# Patient Record
Sex: Female | Born: 1975 | Race: Black or African American | Hispanic: No | Marital: Single | Smoking: Current some day smoker
Health system: Southern US, Community
[De-identification: ages and names within clinical notes are randomized; demographics above are authoritative.]

## PROBLEM LIST (undated history)

## (undated) DIAGNOSIS — E669 Obesity, unspecified: Secondary | ICD-10-CM

## (undated) DIAGNOSIS — I10 Essential (primary) hypertension: Secondary | ICD-10-CM

## (undated) HISTORY — DX: Obesity, unspecified: E66.9

---
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
## 1998-05-16 ENCOUNTER — Emergency Department (HOSPITAL_COMMUNITY): Admission: EM | Admit: 1998-05-16 | Discharge: 1998-05-16 | Payer: Self-pay | Admitting: Family Medicine

## 1998-05-18 ENCOUNTER — Emergency Department (HOSPITAL_COMMUNITY): Admission: EM | Admit: 1998-05-18 | Discharge: 1998-05-18 | Payer: Self-pay | Admitting: Emergency Medicine

## 2000-05-12 ENCOUNTER — Encounter (INDEPENDENT_AMBULATORY_CARE_PROVIDER_SITE_OTHER): Payer: Self-pay

## 2000-05-12 ENCOUNTER — Other Ambulatory Visit: Admission: RE | Admit: 2000-05-12 | Discharge: 2000-05-12 | Payer: Self-pay | Admitting: Obstetrics

## 2000-09-29 ENCOUNTER — Other Ambulatory Visit: Admission: RE | Admit: 2000-09-29 | Discharge: 2000-09-29 | Payer: Self-pay | Admitting: Obstetrics & Gynecology

## 2000-09-29 ENCOUNTER — Encounter: Admission: RE | Admit: 2000-09-29 | Discharge: 2000-09-29 | Payer: Self-pay | Admitting: Obstetrics & Gynecology

## 2001-11-19 ENCOUNTER — Emergency Department (HOSPITAL_COMMUNITY): Admission: EM | Admit: 2001-11-19 | Discharge: 2001-11-19 | Payer: Self-pay | Admitting: Emergency Medicine

## 2002-05-24 ENCOUNTER — Emergency Department (HOSPITAL_COMMUNITY): Admission: EM | Admit: 2002-05-24 | Discharge: 2002-05-24 | Payer: Self-pay | Admitting: Emergency Medicine

## 2002-07-08 ENCOUNTER — Emergency Department (HOSPITAL_COMMUNITY): Admission: EM | Admit: 2002-07-08 | Discharge: 2002-07-08 | Payer: Self-pay | Admitting: Emergency Medicine

## 2002-09-05 ENCOUNTER — Emergency Department (HOSPITAL_COMMUNITY): Admission: EM | Admit: 2002-09-05 | Discharge: 2002-09-05 | Payer: Self-pay | Admitting: Emergency Medicine

## 2005-03-09 ENCOUNTER — Ambulatory Visit (HOSPITAL_COMMUNITY): Admission: RE | Admit: 2005-03-09 | Discharge: 2005-03-09 | Payer: Self-pay | Admitting: Obstetrics and Gynecology

## 2007-09-09 ENCOUNTER — Emergency Department (HOSPITAL_COMMUNITY): Admission: EM | Admit: 2007-09-09 | Discharge: 2007-09-09 | Payer: Self-pay | Admitting: Emergency Medicine

## 2007-12-11 ENCOUNTER — Emergency Department (HOSPITAL_COMMUNITY): Admission: EM | Admit: 2007-12-11 | Discharge: 2007-12-11 | Payer: Self-pay | Admitting: Emergency Medicine

## 2007-12-26 ENCOUNTER — Ambulatory Visit (HOSPITAL_COMMUNITY): Admission: RE | Admit: 2007-12-26 | Discharge: 2007-12-26 | Payer: Self-pay | Admitting: Family Medicine

## 2007-12-27 ENCOUNTER — Ambulatory Visit: Payer: Self-pay | Admitting: Family Medicine

## 2008-01-17 ENCOUNTER — Ambulatory Visit: Payer: Self-pay | Admitting: *Deleted

## 2008-01-23 ENCOUNTER — Ambulatory Visit (HOSPITAL_COMMUNITY): Admission: RE | Admit: 2008-01-23 | Discharge: 2008-01-23 | Payer: Self-pay | Admitting: Family Medicine

## 2008-02-07 ENCOUNTER — Ambulatory Visit: Payer: Self-pay | Admitting: Obstetrics & Gynecology

## 2008-02-21 ENCOUNTER — Ambulatory Visit: Payer: Self-pay | Admitting: Obstetrics & Gynecology

## 2008-02-22 ENCOUNTER — Ambulatory Visit (HOSPITAL_COMMUNITY): Admission: RE | Admit: 2008-02-22 | Discharge: 2008-02-22 | Payer: Self-pay | Admitting: Gynecology

## 2008-03-06 ENCOUNTER — Ambulatory Visit: Payer: Self-pay | Admitting: Family Medicine

## 2008-03-20 ENCOUNTER — Ambulatory Visit: Payer: Self-pay | Admitting: Obstetrics & Gynecology

## 2008-03-24 ENCOUNTER — Ambulatory Visit (HOSPITAL_COMMUNITY): Admission: RE | Admit: 2008-03-24 | Discharge: 2008-03-24 | Payer: Self-pay | Admitting: Obstetrics & Gynecology

## 2008-03-24 ENCOUNTER — Ambulatory Visit: Payer: Self-pay | Admitting: Obstetrics & Gynecology

## 2008-04-03 ENCOUNTER — Ambulatory Visit: Payer: Self-pay | Admitting: Obstetrics & Gynecology

## 2008-04-10 ENCOUNTER — Ambulatory Visit: Payer: Self-pay | Admitting: Obstetrics & Gynecology

## 2008-04-17 ENCOUNTER — Ambulatory Visit: Payer: Self-pay | Admitting: Obstetrics & Gynecology

## 2008-04-24 ENCOUNTER — Ambulatory Visit: Payer: Self-pay | Admitting: Obstetrics & Gynecology

## 2008-04-24 ENCOUNTER — Ambulatory Visit (HOSPITAL_COMMUNITY): Admission: RE | Admit: 2008-04-24 | Discharge: 2008-04-24 | Payer: Self-pay | Admitting: Obstetrics and Gynecology

## 2008-05-08 ENCOUNTER — Ambulatory Visit: Payer: Self-pay | Admitting: Obstetrics & Gynecology

## 2008-05-15 ENCOUNTER — Ambulatory Visit (HOSPITAL_COMMUNITY): Admission: RE | Admit: 2008-05-15 | Discharge: 2008-05-15 | Payer: Self-pay | Admitting: Obstetrics & Gynecology

## 2008-05-22 ENCOUNTER — Ambulatory Visit: Payer: Self-pay | Admitting: Obstetrics & Gynecology

## 2008-05-26 ENCOUNTER — Inpatient Hospital Stay (HOSPITAL_COMMUNITY): Admission: AD | Admit: 2008-05-26 | Discharge: 2008-05-28 | Payer: Self-pay | Admitting: Family Medicine

## 2008-05-26 ENCOUNTER — Ambulatory Visit: Payer: Self-pay | Admitting: Physician Assistant

## 2008-05-26 ENCOUNTER — Ambulatory Visit: Payer: Self-pay | Admitting: Obstetrics & Gynecology

## 2008-05-26 ENCOUNTER — Encounter: Payer: Self-pay | Admitting: Family Medicine

## 2008-05-26 LAB — CONVERTED CEMR LAB
Alkaline Phosphatase: 87 units/L (ref 39–117)
CO2: 28 meq/L (ref 19–32)
Creatinine, Ser: 0.43 mg/dL (ref 0.40–1.20)
Creatinine, Urine: 76.8 mg/dL
Glucose, Bld: 100 mg/dL — ABNORMAL HIGH (ref 70–99)
Hemoglobin: 10.6 g/dL — ABNORMAL LOW (ref 12.0–15.0)
MCV: 89.4 fL (ref 78.0–100.0)
Protein, Ur: 173 mg/24hr — ABNORMAL HIGH (ref 50–100)
RBC: 3.54 M/uL — ABNORMAL LOW (ref 3.87–5.11)
Total Protein: 5.8 g/dL — ABNORMAL LOW (ref 6.0–8.3)
Uric Acid, Serum: 4 mg/dL (ref 2.4–7.0)

## 2008-05-29 ENCOUNTER — Ambulatory Visit (HOSPITAL_COMMUNITY): Admission: RE | Admit: 2008-05-29 | Discharge: 2008-05-29 | Payer: Self-pay | Admitting: Obstetrics & Gynecology

## 2008-06-05 ENCOUNTER — Ambulatory Visit: Payer: Self-pay | Admitting: Family Medicine

## 2008-06-05 ENCOUNTER — Inpatient Hospital Stay (HOSPITAL_COMMUNITY): Admission: AD | Admit: 2008-06-05 | Discharge: 2008-06-07 | Payer: Self-pay | Admitting: Obstetrics & Gynecology

## 2008-06-05 LAB — CONVERTED CEMR LAB: GC Probe Amp, Genital: NEGATIVE

## 2008-06-09 ENCOUNTER — Inpatient Hospital Stay (HOSPITAL_COMMUNITY): Admission: AD | Admit: 2008-06-09 | Discharge: 2008-06-09 | Payer: Self-pay | Admitting: Family Medicine

## 2008-06-26 ENCOUNTER — Ambulatory Visit: Payer: Self-pay | Admitting: Family Medicine

## 2008-06-26 ENCOUNTER — Inpatient Hospital Stay (HOSPITAL_COMMUNITY): Admission: AD | Admit: 2008-06-26 | Discharge: 2008-06-29 | Payer: Self-pay | Admitting: Obstetrics & Gynecology

## 2008-07-25 DIAGNOSIS — I10 Essential (primary) hypertension: Secondary | ICD-10-CM

## 2008-07-25 DIAGNOSIS — Z8742 Personal history of other diseases of the female genital tract: Secondary | ICD-10-CM

## 2008-07-25 HISTORY — DX: Essential (primary) hypertension: I10

## 2008-07-25 HISTORY — DX: Personal history of other diseases of the female genital tract: Z87.42

## 2009-06-10 IMAGING — US US OB DETAIL+14 WK
1 series · 14 of 28 positions shown · non-contrast
Comparison: none

OBSTETRICAL ULTRASOUND:
 This ultrasound exam was performed in the [HOSPITAL] Ultrasound Department.  The OB US report was generated in the AS system, and faxed to the ordering physician.  This report is also available in [REDACTED] PACS.

[Series 1: us ob detail +14 wk · 78 acquisitions, 14 frames shown]
[im 3/78]
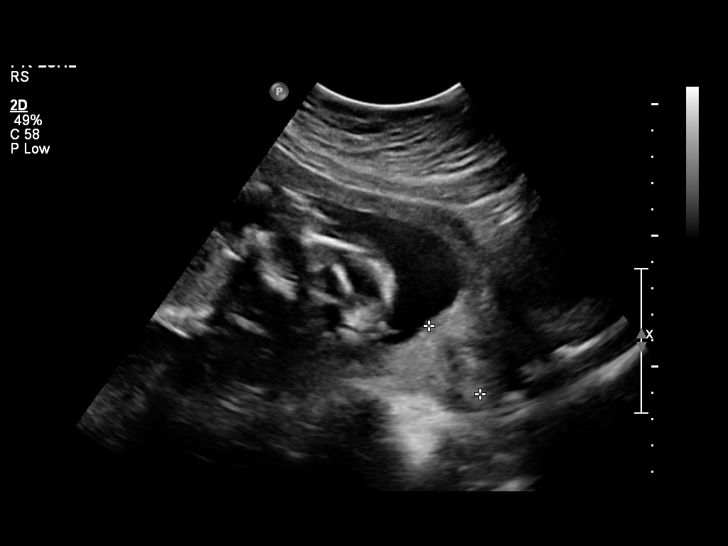
[im 9/78]
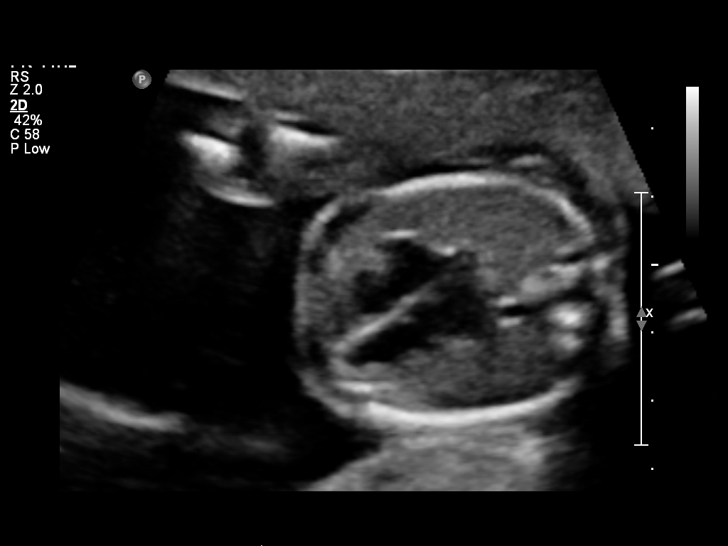
[im 15/78]
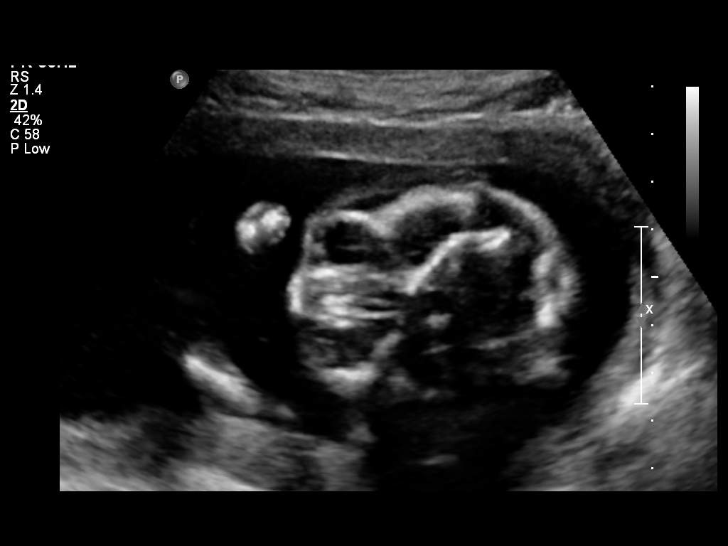
[im 20/78]
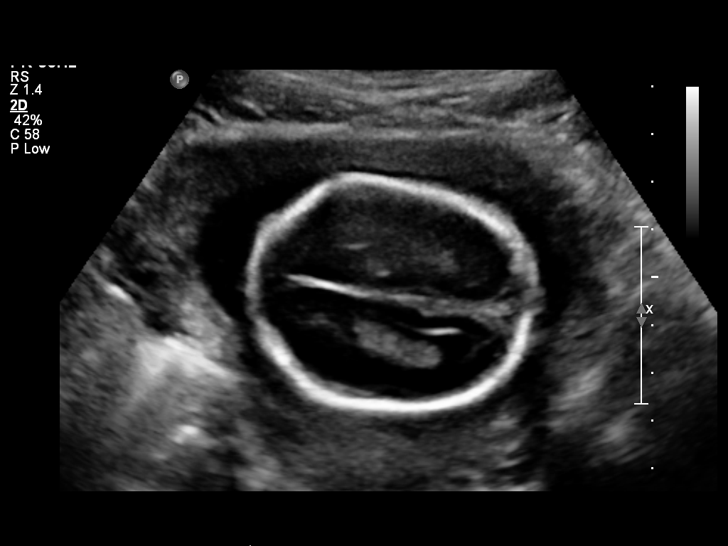
[im 26/78]
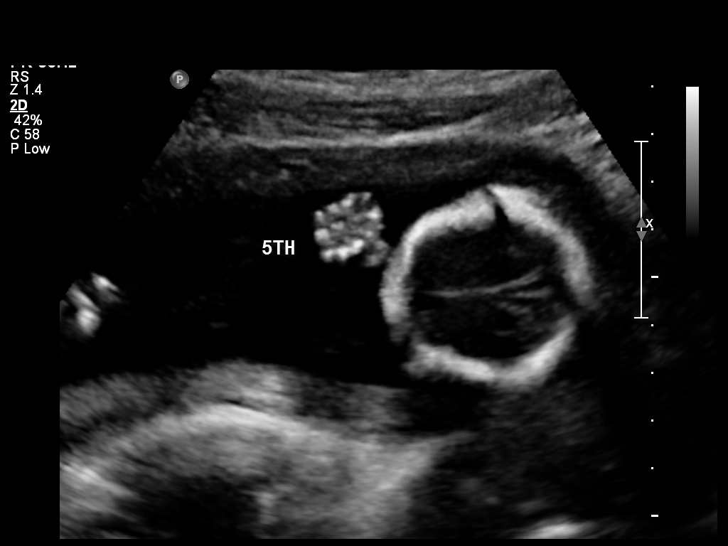
[im 32/78]
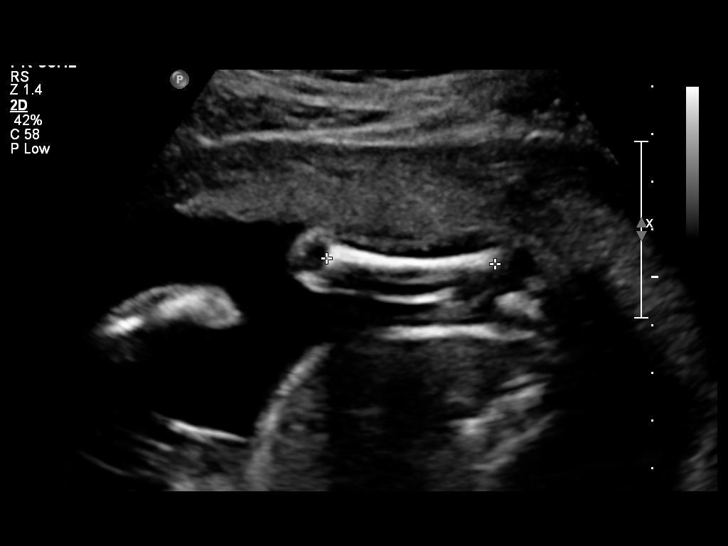
[im 38/78]
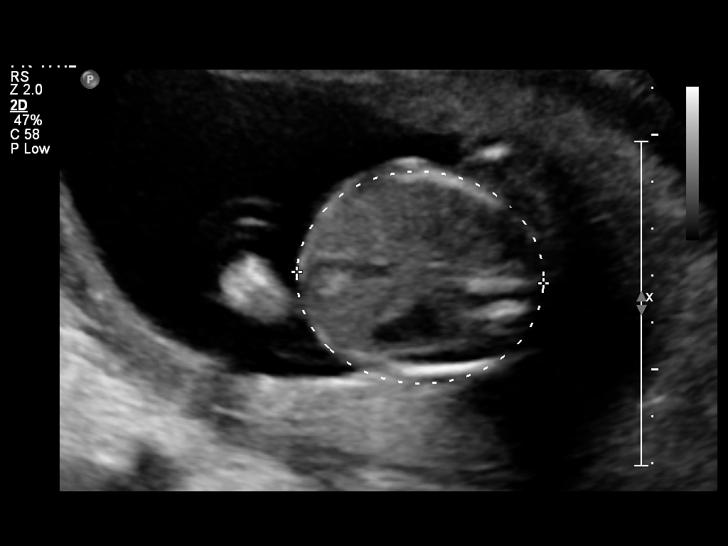
[im 43/78]
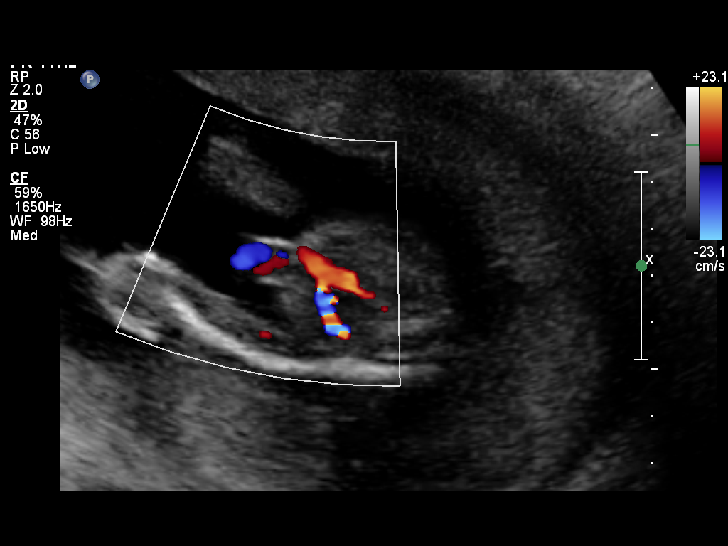
[im 49/78]
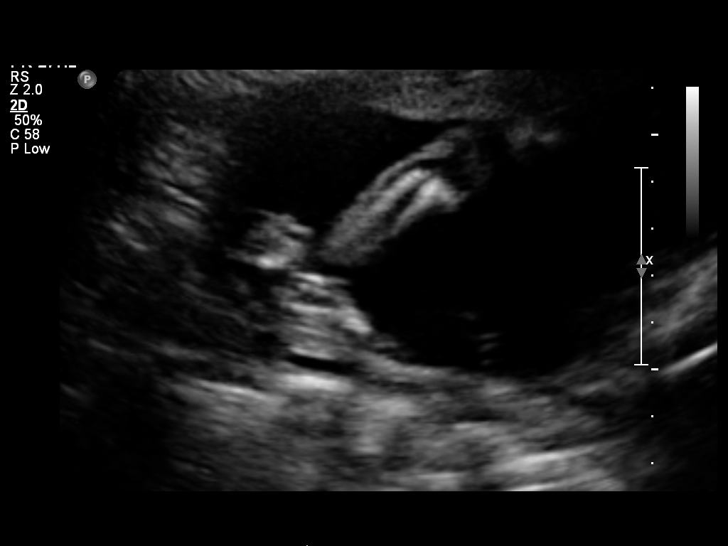
[im 55/78]
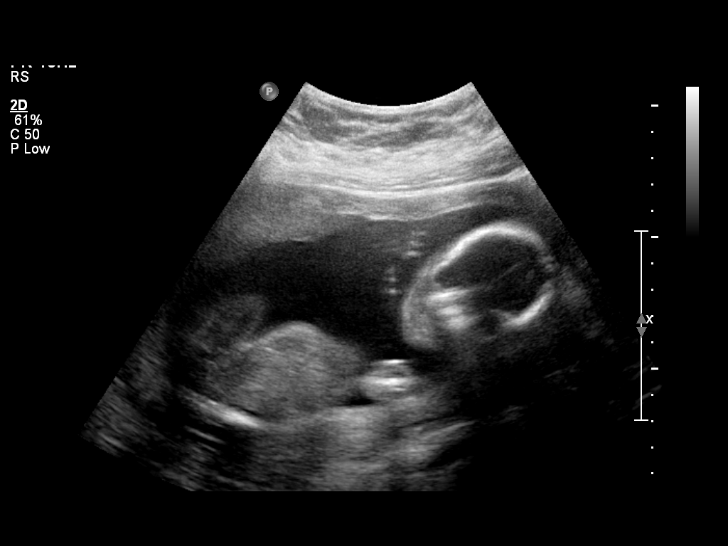
[im 60/78]
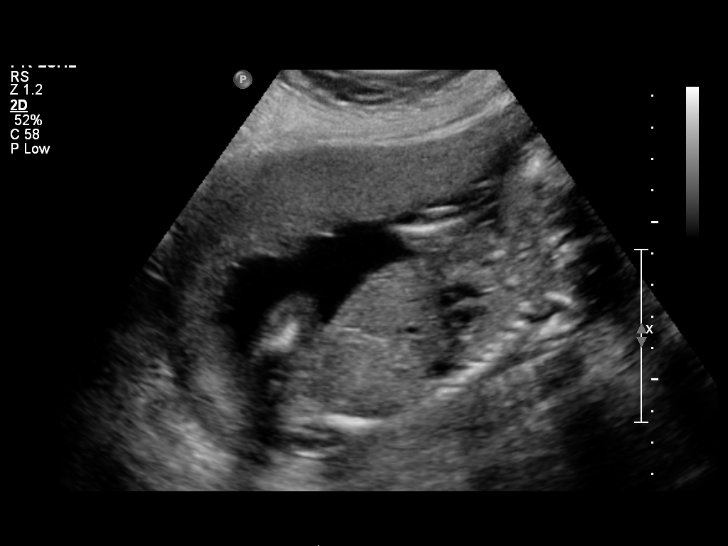
[im 66/78]
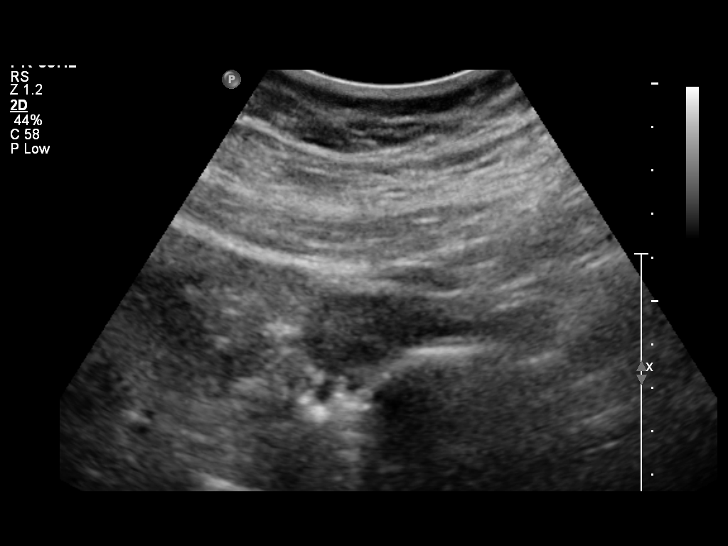
[im 72/78]
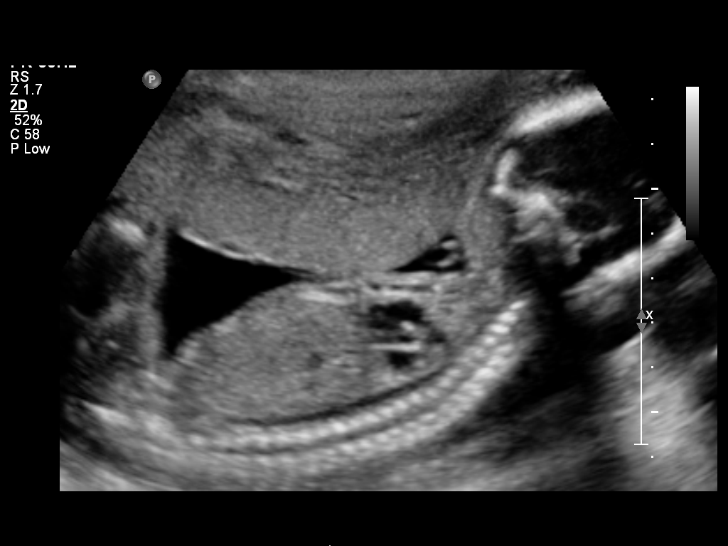
[im 78/78]
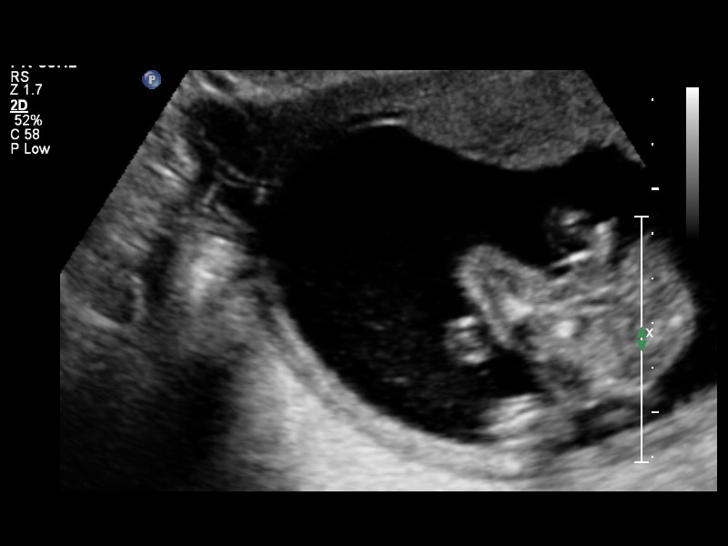

[14 of 28 positions shown; findings below may reference images not displayed]

IMPRESSION: See AS Obstetric US report.

## 2009-08-11 IMAGING — US US OB FOLLOW-UP
1 series · 14 of 28 positions shown · non-contrast
Comparison: none

OBSTETRICAL ULTRASOUND:
 This ultrasound exam was performed in the [HOSPITAL] Ultrasound Department.  The OB US report was generated in the AS system, and faxed to the ordering physician.  This report is also available in [REDACTED] PACS.

[Series 1: us ob re-eval · 29 acquisitions, 14 frames shown]
[im 2/29]
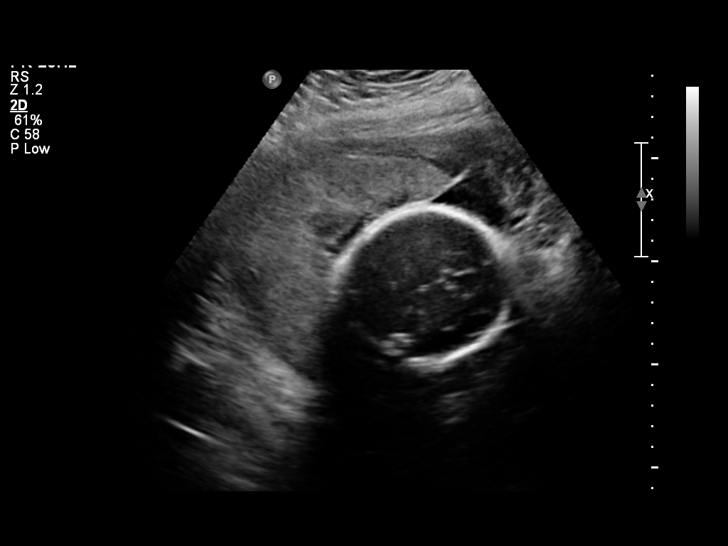
[im 4/29]
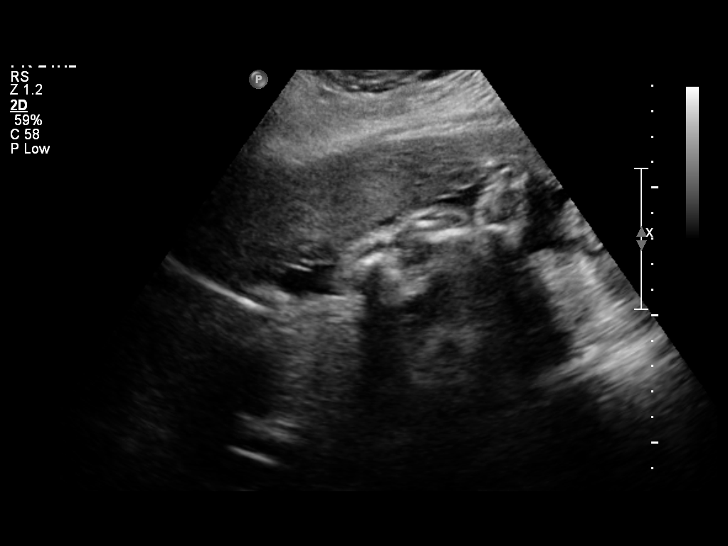
[im 6/29]
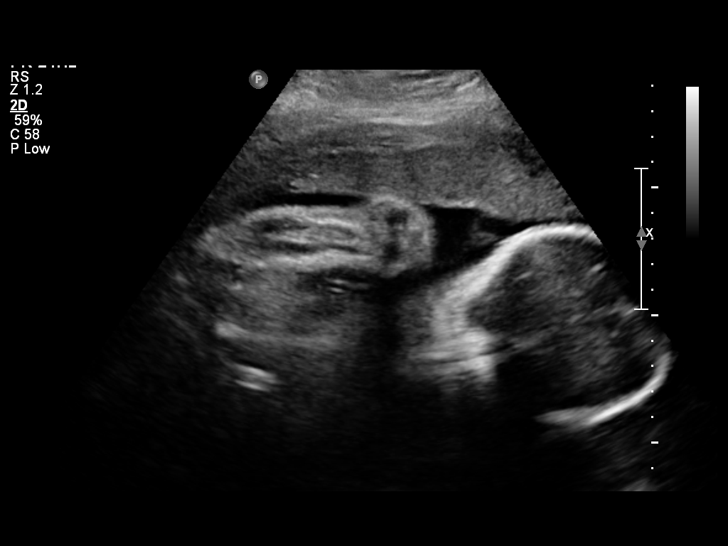
[im 8/29]
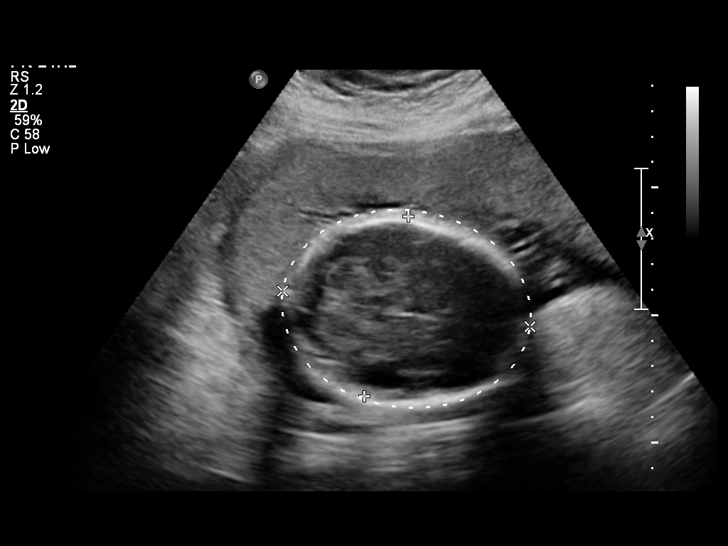
[im 10/29]
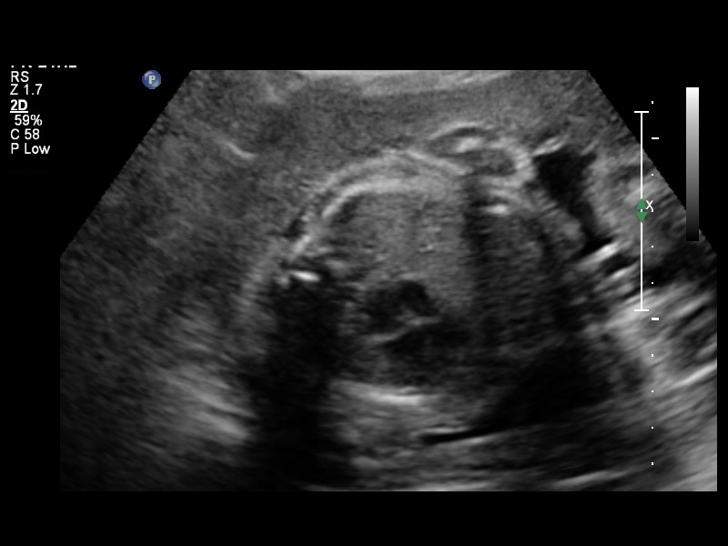
[im 12/29]
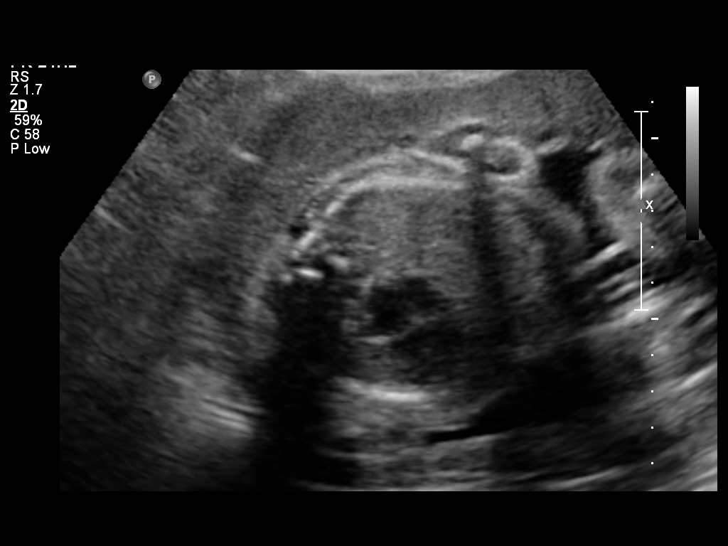
[im 14/29]
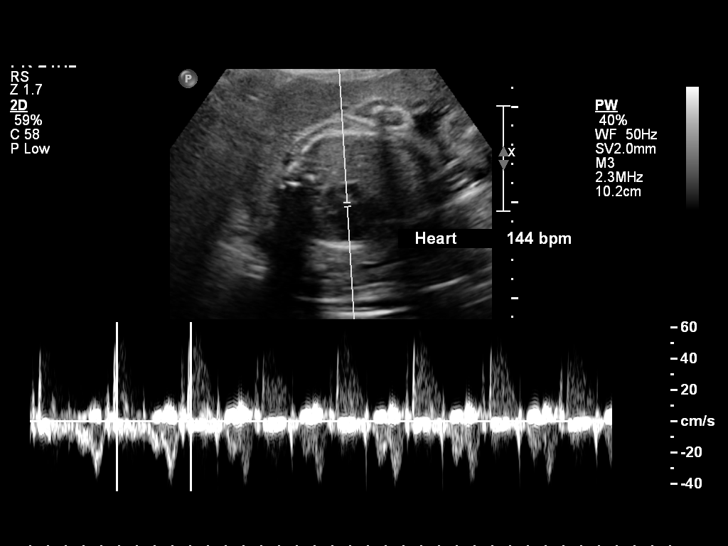
[im 16/29]
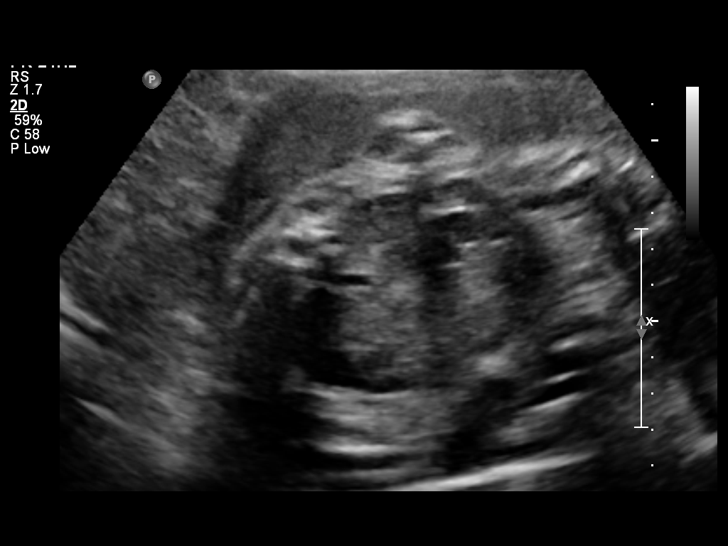
[im 18/29]
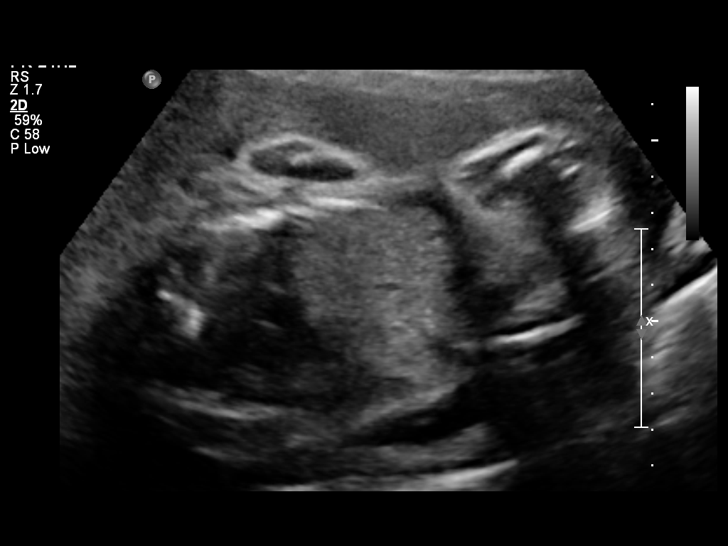
[im 20/29]
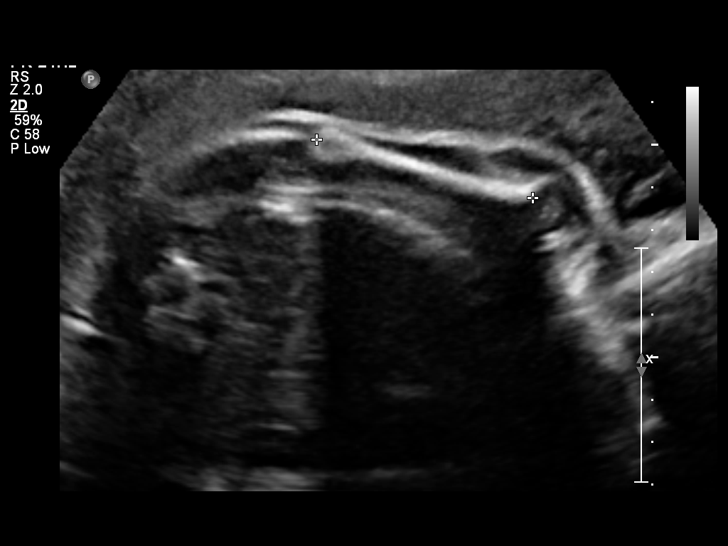
[im 22/29]
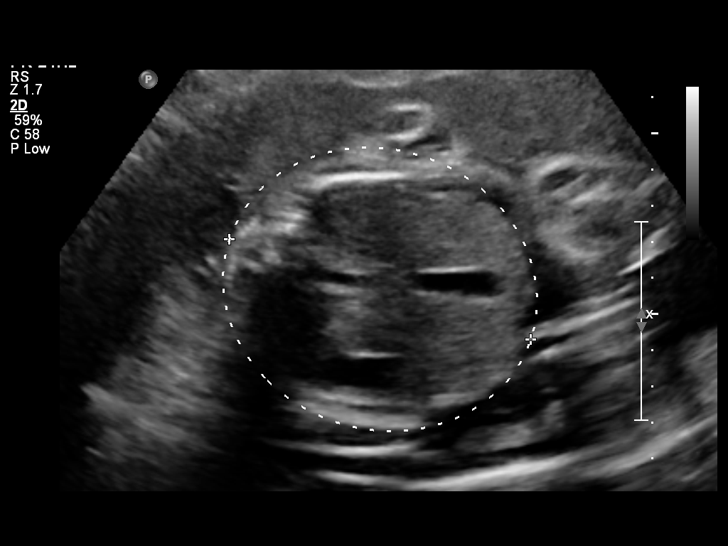
[im 24/29]
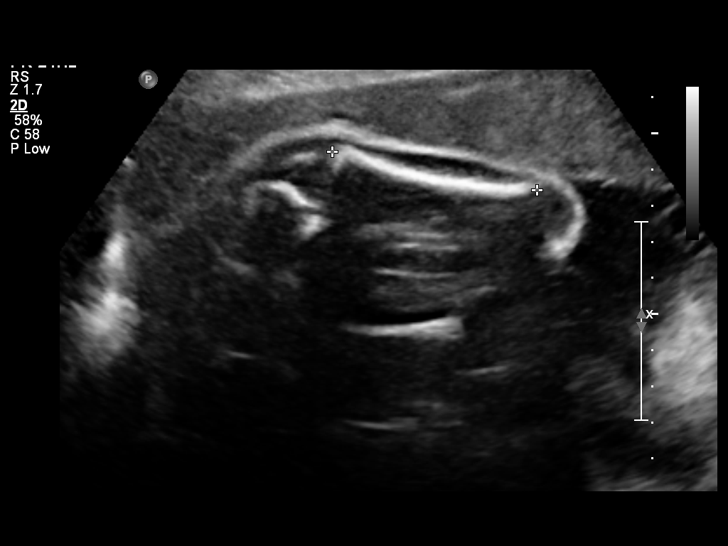
[im 26/29]
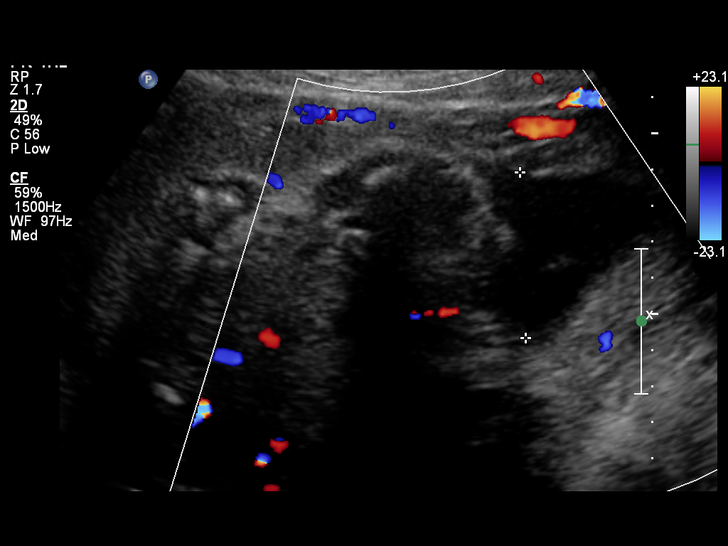
[im 29/29]
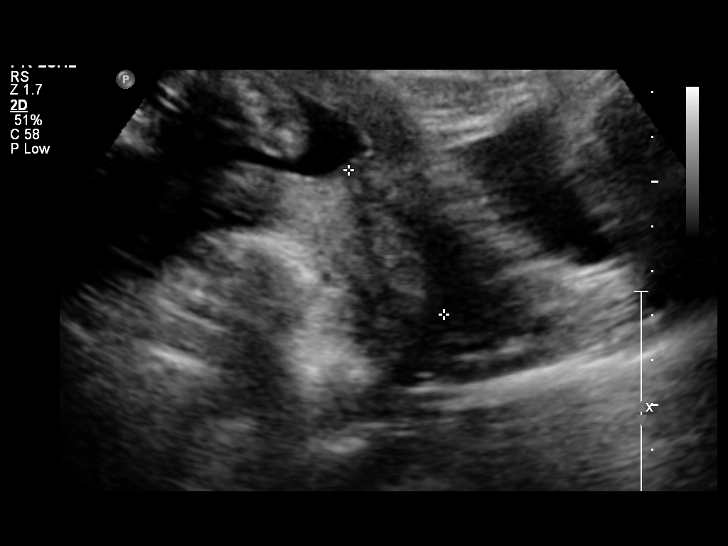

[14 of 28 positions shown; findings below may reference images not displayed]

IMPRESSION: See AS Obstetric US report.

## 2010-05-24 ENCOUNTER — Encounter (INDEPENDENT_AMBULATORY_CARE_PROVIDER_SITE_OTHER): Payer: Self-pay | Admitting: *Deleted

## 2010-05-24 LAB — CONVERTED CEMR LAB
CO2: 27 meq/L (ref 19–32)
Calcium: 9.7 mg/dL (ref 8.4–10.5)
Glucose, Bld: 111 mg/dL — ABNORMAL HIGH (ref 70–99)
Sodium: 140 meq/L (ref 135–145)

## 2010-06-22 ENCOUNTER — Encounter (INDEPENDENT_AMBULATORY_CARE_PROVIDER_SITE_OTHER): Payer: Self-pay | Admitting: *Deleted

## 2010-06-22 LAB — CONVERTED CEMR LAB
Cholesterol: 182 mg/dL (ref 0–200)
HDL: 43 mg/dL (ref >39)

## 2010-08-24 ENCOUNTER — Emergency Department (HOSPITAL_COMMUNITY)
Admission: EM | Admit: 2010-08-24 | Discharge: 2010-08-24 | Payer: Self-pay | Source: Home / Self Care | Admitting: Emergency Medicine

## 2010-08-26 ENCOUNTER — Ambulatory Visit (INDEPENDENT_AMBULATORY_CARE_PROVIDER_SITE_OTHER): Payer: Self-pay

## 2010-08-26 ENCOUNTER — Inpatient Hospital Stay (HOSPITAL_COMMUNITY)
Admission: RE | Admit: 2010-08-26 | Discharge: 2010-08-26 | Disposition: A | Discharge status: Home / Self Care | Payer: Self-pay | Source: Ambulatory Visit | Attending: Family Medicine | Admitting: Family Medicine

## 2010-08-26 DIAGNOSIS — L02219 Cutaneous abscess of trunk, unspecified: Secondary | ICD-10-CM

## 2010-12-07 NOTE — Discharge Summary (Signed)
NAME:  Cindy Daniel, Cindy Daniel               ACCOUNT NO.:  [id number]   MEDICAL RECORD NO.:  [id number]          PATIENT TYPE:  INP   LOCATION:  9156                          [facility]ACILITY:  [facility]   PHYSICIAN:  Tanya S. Shawnie Pons, M.D.   DATE OF BIRTH:  09-Feb-1976   DATE OF ADMISSION:  05/26/2008  DATE OF DISCHARGE:  05/28/2008                               DISCHARGE SUMMARY   ADMITTING DIAGNOSIS:  A 35 year old gravida 3, para 1-0-1-3 at 33 weeks  and 4 days' with elevated blood pressures.   DISCHARGE DIAGNOSIS:  On May 28, 2008, a 35 year old at 3 weeks and  5/7 days with chronic hypertension.  She is in stable status at the time  of her discharge.   IN BRIEF:  At the admission, the patient is a 35 year old at 54 weeks  and 4 days.  She [facility] with markedly  elevated blood pressures.  She was admitted for rule out preeclampsia.  During the work course of her hospital stay, the patient's blood  pressures were controlled on oral labetalol.  PIH labs and fetal testing  were reassuring that there was no signs and symptoms of superimposed  preeclampsia after 48 hours and collection of a 24-hour urine that was  found to be normal.  The patient was discharged on May 28, 2008.   The patient's discharge prescriptions are labetalol 400 mg p.o. b.i.d.   FOLLOWUP INSTRUCTIONS:  To continue modified bedrest and to follow up on  June 02, 2008 in the [facility] at 0915, to start twice weekly  NSTs and antenatal testing.  Pertinent labs, 24-hour urine was found to  have 173 mg of protein.      Maylon Cos, C.N.M.      Shelbie Proctor. Shawnie Pons, M.D.  Electronically Signed    SS/MEDQ  D:  08/21/2008  T:  08/22/2008  Job:  [id number]

## 2010-12-07 NOTE — Discharge Summary (Signed)
NAMEALEKA, TWITTY NO.:  [id number]   MEDICAL RECORD NO.:  [id number]         PATIENT TYPE:  WINP   LOCATION:                                [facility]   PHYSICIAN:  Lazaro Arms, M.D.   DATE OF BIRTH:  07-24-1976   DATE OF ADMISSION:  06/05/2008  DATE OF DISCHARGE:                               DISCHARGE SUMMARY   ADMITTING DIAGNOSES:  1. Intrauterine pregnancy at 34-6/7 weeks.  2. Chronic hypertension.   Ms. Sulak is a 35 year old gravida 3, para 1-1-0-1 black female who was  admitted at 34-6/7 weeks' gestation for monitoring and evaluation of  elevated blood pressure.   HOSPITAL COURSE:  Upon admission, her blood pressure was 178/97.  She  had been [facility].  She was currently on  labetalol 400 mg b.i.d.  Her cervix was closed and her fetal heart rate  tracing was reactive.  She was taken to the Antenatal Unit and had PIH  labs drawn.  Those laboratories are; hemoglobin 10.7, hematocrit 31.1,  Odem blood count 5.3 and platelets 281,000.  Uric acid was 4.8.  LDH  was 172.  SGOT was 24 and SGPT was 27.  Her labetalol was increased to  600 mg b.i.d. and a 24-hour urine collection was started.  She had an  ultrasound as well while here, which showed growth at the 85th  percentile with an estimated weight of 6 pounds and 8 ounces.  Amniotic  fluid was at the 23rd percentile and BPP was 8/8.  While hospitalized,  her blood pressures were in the 140s/86-90.  Other vital signs were  stable.  She was afebrile.  A 24-hour urine for protein was resulted on  June 06, 2008, and was 280 mg.  By hospital day #2, she was deemed  to have received the full benefit of her hospital stay.  Blood pressures  were between 142 and 158 over 74-80.  Other vital signs remained stable.  Fetal heart rate tracing remained reactive and reassuring with rare  contractions.  The patient denied any signs or symptoms of preeclampsia.  She denied any  pregnancy complications and she was discharged home.   DISCHARGE INSTRUCTIONS:  Per antenatal handout including PIH  precautions.  Activity level was to be bed rest over the weekend and  that activity to be reevaluated at the clinic on June 09, 2008.   DISCHARGE FOLLOWUP:  Will occur on June 09, 2008, at the [facility] including a nonstress test.   DISCHARGE MEDICATIONS:  1. Labetalol 600 mg 1 p.o. b.i.d.  2. Prenatal vitamin 1 p.o. daily.  3. Iron 2 p.o. daily as previously instructed.      Cam Hai, C.N.M.      Lazaro Arms, M.D.  Electronically Signed    KS/MEDQ  D:  06/07/2008  T:  06/07/2008  Job:  [id number]

## 2011-04-21 LAB — POCT URINALYSIS DIP (DEVICE)
Bilirubin Urine: NEGATIVE
Glucose, UA: NEGATIVE
Glucose, UA: NEGATIVE
Hgb urine dipstick: NEGATIVE
Ketones, ur: NEGATIVE
Nitrite: NEGATIVE
Operator id: 148111
Operator id: 297281
Protein, ur: 30 — AB
Specific Gravity, Urine: 1.02
Urobilinogen, UA: 1
Urobilinogen, UA: 1
pH: 8.5 — ABNORMAL HIGH

## 2011-04-22 LAB — POCT URINALYSIS DIP (DEVICE)
Bilirubin Urine: NEGATIVE
Glucose, UA: NEGATIVE
Glucose, UA: NEGATIVE
Glucose, UA: NEGATIVE
Hgb urine dipstick: NEGATIVE
Ketones, ur: NEGATIVE
Nitrite: NEGATIVE
Operator id: 297281
Operator id: 159681
Protein, ur: 30 — AB
Protein, ur: NEGATIVE
Specific Gravity, Urine: 1.02
pH: 8

## 2011-04-25 LAB — POCT URINALYSIS DIP (DEVICE)
Bilirubin Urine: NEGATIVE
Bilirubin Urine: NEGATIVE
Bilirubin Urine: NEGATIVE
Bilirubin Urine: NEGATIVE
Glucose, UA: NEGATIVE
Glucose, UA: NEGATIVE
Glucose, UA: NEGATIVE
Hgb urine dipstick: NEGATIVE
Hgb urine dipstick: NEGATIVE
Hgb urine dipstick: NEGATIVE
Ketones, ur: NEGATIVE
Ketones, ur: NEGATIVE
Ketones, ur: NEGATIVE
Ketones, ur: NEGATIVE
Nitrite: NEGATIVE
Nitrite: NEGATIVE
Nitrite: NEGATIVE
Operator id: 194561
Operator id: 287931
Protein, ur: NEGATIVE
Protein, ur: 30 — AB
Protein, ur: 30 — AB
Specific Gravity, Urine: 1.015
Specific Gravity, Urine: 1.015
Urobilinogen, UA: 1
Urobilinogen, UA: 0.2
pH: 7.5
pH: 6.5
pH: 8

## 2011-04-26 LAB — COMPREHENSIVE METABOLIC PANEL
ALT: 27
AST: 24
Alkaline Phosphatase: 93
CO2: 27
Calcium: 8.8
GFR calc Af Amer: >60
GFR calc non Af Amer: >60
Potassium: 3 — ABNORMAL LOW
Sodium: 139

## 2011-04-26 LAB — PROTEIN, URINE, 24 HOUR
Collection Interval-UPROT: 24
Protein, Urine: 7

## 2011-04-26 LAB — POCT URINALYSIS DIP (DEVICE)
Hgb urine dipstick: NEGATIVE
Ketones, ur: NEGATIVE
Protein, ur: 30 — AB
Specific Gravity, Urine: 1.01
Urobilinogen, UA: 1
pH: 7

## 2011-04-26 LAB — URINALYSIS, DIPSTICK ONLY
Glucose, UA: NEGATIVE
Hgb urine dipstick: NEGATIVE
Ketones, ur: NEGATIVE
Protein, ur: NEGATIVE
Urobilinogen, UA: 0.2

## 2011-04-26 LAB — CBC
Hemoglobin: 10.7 — ABNORMAL LOW
MCHC: 34.3
RBC: 3.53 — ABNORMAL LOW
WBC: 5.3

## 2011-04-26 LAB — CREATININE CLEARANCE, URINE, 24 HOUR
Creatinine Clearance: 386 — ABNORMAL HIGH
Creatinine, 24H Ur: 2336 — ABNORMAL HIGH
Creatinine: 0.42 (ref 0.40–1.20)

## 2011-04-27 LAB — POCT URINALYSIS DIP (DEVICE)
Bilirubin Urine: NEGATIVE
Glucose, UA: NEGATIVE
Nitrite: NEGATIVE
Operator id: 200901
Specific Gravity, Urine: 1.01
Urobilinogen, UA: 0.2

## 2011-04-29 LAB — CROSSMATCH
ABO/RH(D): A POS
Antibody Screen: NEGATIVE

## 2011-04-29 LAB — POCT URINALYSIS DIP (DEVICE)
Bilirubin Urine: NEGATIVE
Glucose, UA: NEGATIVE mg/dL
Nitrite: NEGATIVE

## 2011-04-29 LAB — COMPREHENSIVE METABOLIC PANEL
ALT: 20 U/L (ref 0–35)
AST: 18 U/L (ref 0–37)
CO2: 26 mEq/L (ref 19–32)
Chloride: 103 mEq/L (ref 96–112)
GFR calc Af Amer: >60 mL/min (ref >60)
GFR calc non Af Amer: >60 mL/min (ref >60)
Potassium: 3.6 mEq/L (ref 3.5–5.1)
Sodium: 137 mEq/L (ref 135–145)
Total Bilirubin: 0.4 mg/dL (ref 0.3–1.2)

## 2011-04-29 LAB — APTT: aPTT: 31 seconds (ref 24–37)

## 2011-04-29 LAB — CBC
RBC: 3.63 MIL/uL — ABNORMAL LOW (ref 3.87–5.11)
WBC: 5.6 10*3/uL (ref 4.0–10.5)

## 2014-10-21 ENCOUNTER — Encounter (HOSPITAL_COMMUNITY): Payer: Self-pay | Admitting: *Deleted

## 2014-10-21 ENCOUNTER — Emergency Department (HOSPITAL_COMMUNITY)
Admission: EM | Admit: 2014-10-21 | Discharge: 2014-10-21 | Disposition: A | Discharge status: Home / Self Care | Payer: Self-pay | Attending: Emergency Medicine | Admitting: Emergency Medicine

## 2014-10-21 DIAGNOSIS — Z720 Tobacco use: Secondary | ICD-10-CM | POA: Insufficient documentation

## 2014-10-21 DIAGNOSIS — K088 Other specified disorders of teeth and supporting structures: Secondary | ICD-10-CM | POA: Insufficient documentation

## 2014-10-21 DIAGNOSIS — K0381 Cracked tooth: Secondary | ICD-10-CM | POA: Insufficient documentation

## 2014-10-21 DIAGNOSIS — K0889 Other specified disorders of teeth and supporting structures: Secondary | ICD-10-CM

## 2014-10-21 DIAGNOSIS — I10 Essential (primary) hypertension: Secondary | ICD-10-CM | POA: Insufficient documentation

## 2014-10-21 HISTORY — DX: Essential (primary) hypertension: I10

## 2014-10-21 MED ORDER — LISINOPRIL 10 MG PO TABS: 10.0000 mg | ORAL_TABLET | Freq: Every day | ORAL | Status: DC

## 2014-10-21 MED ORDER — PENICILLIN V POTASSIUM 250 MG PO TABS
500.0000 mg | ORAL_TABLET | Freq: Once | ORAL | Status: AC
  Administered 2014-10-21: 500 mg via ORAL
  Filled 2014-10-21: qty 2

## 2014-10-21 MED ORDER — IBUPROFEN 800 MG PO TABS: 800.0000 mg | ORAL_TABLET | Freq: Three times a day (TID) | ORAL | Status: DC

## 2014-10-21 MED ORDER — IBUPROFEN 400 MG PO TABS
800.0000 mg | ORAL_TABLET | Freq: Once | ORAL | Status: AC
  Administered 2014-10-21: 800 mg via ORAL
  Filled 2014-10-21: qty 2

## 2014-10-21 MED ORDER — PENICILLIN V POTASSIUM 500 MG PO TABS: 500.0000 mg | ORAL_TABLET | Freq: Four times a day (QID) | ORAL | Status: DC

## 2014-10-21 NOTE — ED Notes (Signed)
Pt reports left lower dental pain since yesterday, swelling noted but airway intact.

## 2014-10-21 NOTE — ED Notes (Signed)
Patient states ibuprofen 800 mg works for her.  States she had not taken anything prior to coming in.

## 2014-10-21 NOTE — ED Notes (Signed)
Declined W/C at D/C and was escorted to lobby by RN. 

## 2014-10-21 NOTE — Discharge Instructions (Signed)
Take Veetid as directed until gone. Take ibuprofen for pain. Refer to attached documents for more information.

## 2014-10-21 NOTE — ED Provider Notes (Signed)
CSN: [id number]     Arrival date & time 10/21/14  4098 History  This chart was scribed for non-physician practitioner, Emilia Beck, PA-C working with Bethann Berkshire, MD by Luisa Dago, Medical Scribe. This patient was seen in room TR06C/TR06C and the patient's care was started at 9:39 AM.     Chief Complaint  Patient presents with  . Dental Pain   Patient is a 39 y.o. female presenting with tooth pain. The history is provided by the patient and medical records. No language interpreter was used.  Dental Pain Location:  Lower Quality:  Aching Severity:  Moderate Onset quality:  Gradual Duration:  1 day Timing:  Constant Progression:  Unchanged Chronicity:  New Context: abscess   Relieved by:  Nothing Worsened by:  Pressure and touching Ineffective treatments:  None tried Associated symptoms: facial swelling   Associated symptoms: no drooling, no facial pain, no fever and no neck pain    HPI Comments: Cindy Daniel is a 39 y.o. female with PMhx of HTN presents to the Emergency Department complaining of left lower dental pain that started yesterday. She is also complaining of mild left side facial swelling. Pt describes the associated pain as "aching" in nature. She is also requesting for her BP medication to be refilled. Pt denies any fever, neck pain, sore throat, visual disturbance, CP, cough, SOB, abdominal pain, nausea, emesis, diarrhea, urinary symptoms, back pain, HA, weakness, numbness and rash as associated symptoms.     Past Medical History  Diagnosis Date  . Hypertension    History reviewed. No pertinent past surgical history. History reviewed. No pertinent family history. History  Substance Use Topics  . Smoking status: Current Every Day Smoker    Types: Cigars  . Smokeless tobacco: Not on file  . Alcohol Use: Yes     Comment: occ   OB History    No data available     Review of Systems  Constitutional: Negative for fever.  HENT: Positive for dental  problem and facial swelling. Negative for drooling and sore throat.   Respiratory: Negative for shortness of breath.   Musculoskeletal: Negative for neck pain and neck stiffness.  All other systems reviewed and are negative.     Allergies  Review of patient's allergies indicates no known allergies.  Home Medications   Prior to Admission medications   Not on File   BP 182/97 mmHg  Pulse 99  Temp(Src) 98.7 F (37.1 C) (Oral)  Resp 18  Ht 5' 6.5" (1.689 m)  Wt 267 lb (121.11 kg)  BMI 42.45 kg/m2  SpO2 100%  Physical Exam  Constitutional: She is oriented to person, place, and time. She appears well-developed and well-nourished. No distress.  HENT:  Head: Normocephalic and atraumatic.  Right Ear: External ear normal.  Left Ear: External ear normal.  Mouth/Throat: Oropharynx is clear and moist. No oropharyngeal exudate.  Fair dentition. Cracked left lower molar with tend to percussion and left lower jaw swelling.   Eyes: Conjunctivae and EOM are normal.  Neck: Normal range of motion. Neck supple.  Cardiovascular: Normal rate.   No murmur heard. Pulmonary/Chest: Effort normal. No respiratory distress.  Abdominal: Soft. She exhibits no distension. There is no tenderness.  Musculoskeletal: Normal range of motion.  Lymphadenopathy:    She has no cervical adenopathy.  Neurological: She is alert and oriented to person, place, and time.  Skin: Skin is warm and dry.  Psychiatric: She has a normal mood and affect. Her behavior is normal.  Nursing note and vitals reviewed.   ED Course  Procedures (including critical care time)  DIAGNOSTIC STUDIES: Oxygen Saturation is 100% on RA, normal by my interpretation.    COORDINATION OF CARE: 9:41 AM-first dose of Ibuprofen 800 mg will be given here along with antibiotics. Pt will also be d/c with a prescription for both. BP medication will also be refilled for the pt. Referral to dentist on call was also given. Pt advised of plan for  treatment and pt agrees.  Labs Review Labs Reviewed - No data to display  Imaging Review No results found.   EKG Interpretation None      MDM   Final diagnoses:  Pain, dental    No signs of ludwigs angina. Vitals stable and patient afebrile. Patient will have ibuprofen as ned Veetid for symptoms. Patient instructed to follow up with referred dentist and return to the ED with worsening or concerning symptoms.   I personally performed the services described in this documentation, which was scribed in my presence. The recorded information has been reviewed and is accurate.    Emilia BeckKaitlyn Jaretssi Kraker, PA-C 10/21/14 1020  Bethann BerkshireJoseph Zammit, MD 10/22/14 1041

## 2015-11-18 ENCOUNTER — Emergency Department (HOSPITAL_COMMUNITY)
Admission: EM | Admit: 2015-11-18 | Discharge: 2015-11-18 | Disposition: A | Discharge status: Home / Self Care | Payer: Self-pay | Attending: Emergency Medicine | Admitting: Emergency Medicine

## 2015-11-18 ENCOUNTER — Encounter (HOSPITAL_COMMUNITY): Payer: Self-pay | Admitting: Emergency Medicine

## 2015-11-18 DIAGNOSIS — K047 Periapical abscess without sinus: Secondary | ICD-10-CM | POA: Insufficient documentation

## 2015-11-18 DIAGNOSIS — Z79899 Other long term (current) drug therapy: Secondary | ICD-10-CM | POA: Insufficient documentation

## 2015-11-18 DIAGNOSIS — F17210 Nicotine dependence, cigarettes, uncomplicated: Secondary | ICD-10-CM | POA: Insufficient documentation

## 2015-11-18 DIAGNOSIS — Z791 Long term (current) use of non-steroidal anti-inflammatories (NSAID): Secondary | ICD-10-CM | POA: Insufficient documentation

## 2015-11-18 DIAGNOSIS — I10 Essential (primary) hypertension: Secondary | ICD-10-CM | POA: Insufficient documentation

## 2015-11-18 DIAGNOSIS — K0381 Cracked tooth: Secondary | ICD-10-CM | POA: Insufficient documentation

## 2015-11-18 MED ORDER — PENICILLIN V POTASSIUM 500 MG PO TABS: 500.0000 mg | ORAL_TABLET | Freq: Four times a day (QID) | ORAL | Status: DC

## 2015-11-18 NOTE — ED Provider Notes (Signed)
CSN: [id number]     Arrival date & time 11/18/15  0804 History   First MD Initiated Contact with Patient 11/18/15 0818     Chief Complaint  Patient presents with  . dental abscess     HPI   Cindy Daniel is a 40 y.o. female with a PMH of HTN who presents to the ED with left lower dental pain and swelling x 2 days. Denies exacerbating factors. States she has been taking aleve with subsequent symptom relief. Denies fever, chills, difficulty swallowing, trouble handling her secretions. Notes she has had similar symptoms in the past, and was treated with antibiotics and ultimately dental extraction.  Past Medical History  Diagnosis Date  . Hypertension    History reviewed. No pertinent past surgical history. No family history on file. Social History  Substance Use Topics  . Smoking status: Current Every Day Smoker    Types: Cigars  . Smokeless tobacco: None  . Alcohol Use: Yes     Comment: occ   OB History    No data available       Review of Systems  Constitutional: Negative for fever and chills.  HENT: Positive for dental problem. Negative for drooling and trouble swallowing.       Allergies  Review of patient's allergies indicates no known allergies.  Home Medications   Prior to Admission medications   Medication Sig Start Date End Date Taking? Authorizing Provider  lisinopril (PRINIVIL,ZESTRIL) 10 MG tablet Take 1 tablet (10 mg total) by mouth daily. 10/21/14  Yes Kaitlyn Szekalski, PA-C  ibuprofen (ADVIL,MOTRIN) 800 MG tablet Take 1 tablet (800 mg total) by mouth 3 (three) times daily. 10/21/14   Kaitlyn Szekalski, PA-C  penicillin v potassium (VEETID) 500 MG tablet Take 1 tablet (500 mg total) by mouth 4 (four) times daily. 11/18/15   Dorise Hiss Maci Eickholt, PA-C    BP 163/107 mmHg  Pulse 72  Temp(Src) 98.8 F (37.1 C) (Oral)  Resp 18  Ht  (1.676 m)  Wt 113.399 kg  BMI 40.37 kg/m2  SpO2 100% Physical Exam  Constitutional: She is oriented to person,  place, and time. She appears well-developed and well-nourished. No distress.  HENT:  Head: Normocephalic and atraumatic.  Right Ear: External ear normal.  Left Ear: External ear normal.  Nose: Nose normal.  Mouth/Throat: Uvula is midline, oropharynx is clear and moist and mucous membranes are normal. No oropharyngeal exudate, posterior oropharyngeal edema, posterior oropharyngeal erythema or tonsillar abscesses.    Left lower 1st molar fractured with associated edema and TTP to gumline. No swelling to floor of mouth.  Eyes: Conjunctivae and EOM are normal. Right eye exhibits no discharge. Left eye exhibits no discharge. No scleral icterus.  Neck: Normal range of motion. Neck supple.  Cardiovascular: Normal rate and regular rhythm.   Pulmonary/Chest: Effort normal and breath sounds normal. No respiratory distress.  Musculoskeletal: Normal range of motion. She exhibits no edema or tenderness.  Neurological: She is alert and oriented to person, place, and time.  Skin: Skin is warm and dry. She is not diaphoretic.  Psychiatric: She has a normal mood and affect. Her behavior is normal.  Nursing note and vitals reviewed.   ED Course  Procedures (including critical care time)  Labs Review Labs Reviewed - No data to display  Imaging Review No results found.    EKG Interpretation None      MDM   Final diagnoses:  Dental abscess    40 year old female presents  with dental pain and swelling x 2 days. Denies fever, chills, difficulty swallowing, trouble handling her secretions. Patient is afebrile. Vital signs stable. Left lower 1st molar fractured with associated edema and TTP to gumline. No obvious abscess amenable to incision and drainage. No swelling to floor of mouth to suggest ludwig's angina. Will treat with PCN. Advised to take tylenol or NSAID for pain. Patient to follow-up with dentist. Return precautions discussed. Patient verbalizes her understanding and is in agreement with  plan.  BP 163/107 mmHg  Pulse 72  Temp(Src) 98.8 F (37.1 C) (Oral)  Resp 18  Ht 5\' 6"  (1.676 m)  Wt 113.399 kg  BMI 40.37 kg/m2  SpO2 100%      Mady Gemmalizabeth C Norely Schlick, PA-C 11/18/15 0831  Gerhard Munchobert Lockwood, MD 11/20/15 2144

## 2015-11-18 NOTE — Discharge Instructions (Signed)
1. Medications: penicillin, tylenol or aleve for pain, usual home medications 2. Treatment: rest, drink plenty of fluids 3. Follow Up: please followup with your dentist for discussion of your diagnoses and further evaluation after today's visit; if you do not have a primary care doctor use the phone number listed in your discharge paperwork to find one; please return to the ER for high fever, chills, difficulty swallowing, trouble handling your secretions, new or worsening symptoms   Dental Abscess A dental abscess is pus in or around a tooth. HOME CARE  Take medicines only as told by your dentist.  If you were prescribed antibiotic medicine, finish all of it even if you start to feel better.  Rinse your mouth (gargle) often with salt water.  Do not drive or use heavy machinery, like a lawn mower, while taking pain medicine.  Do not apply heat to the outside of your mouth.  Keep all follow-up visits as told by your dentist. This is important. GET HELP IF:  Your pain is worse, and medicine does not help. GET HELP RIGHT AWAY IF:  You have a fever or chills.  Your symptoms suddenly get worse.  You have a very bad headache.  You have problems breathing or swallowing.  You have trouble opening your mouth.  You have puffiness (swelling) in your neck or around your eye.   This information is not intended to replace advice given to you by your health care provider. Make sure you discuss any questions you have with your health care provider.   Document Released: 11/25/2014 Document Reviewed: 11/25/2014 Elsevier Interactive Patient Education 2016 [employer].  [employer] 211 is a great source of information about community services available.  Access by dialing 2-1-1 from anywhere in [place], or by website -  PooledIncome.pl.   Other Local Resources (Updated 07/2015)  Dental  Care   Services    Phone Number and Address  Cost    [facility] For children 35 - 58 years of age:   Cleaning  Tooth brushing/flossing instruction  Sealants, fillings, crowns  Extractions  Emergency treatment  [phone] [address] [place], [place] [address] Charges based on family income.  Medicaid and some insurance plans accepted.     [facility], fillings, crowns  Extractions  Emergency treatment [phone] [address] [place], [place]  Pregnant women 30 years of age or older with a Medicaid card  [facility] [place]  Cleaning  Sealants, fillings, crowns  Extractions  Emergency treatment [phone] [address] [place], [place] Pregnant women 17 years of age or older with a Medicaid card  [facility] Department of Health - [facility] For children 65 - 40 years of age:   Cleaning  Tooth brushing/flossing instruction  Sealants, fillings, crowns  Extractions  Emergency treatment Limited orthodontic services for patients with Medicaid [phone] [place], [place] [address] Medicaid and [facility] Health Choice cover for children up to age 90 and pregnant women.  Parents of children up to age 46 without Medicaid pay a reduced fee at time of service.  [facility] Department of [employer] For children 22 - 63 years of age:   Cleaning  Tooth brushing/flossing instruction  Sealants, fillings, crowns  Extractions  Emergency treatment Limited orthodontic services for patients with Medicaid [phone] [address]  [place], [place].  Medicaid and [place] Health Choice cover for children up to age 321 and pregnant women.  Parents of children up to age 38 without Medicaid pay a reduced fee.  [facility]  Cleaning  Sealants, fillings, crowns  Extractions  Hours: Tuesdays and  Thursdays, 4:15 - 8 pm [phone] [address], [address] [place], [place] [address] Services free of charge to [place] residents ages 18-64 who do not have health insurance, Medicare, [place], or [place] benefits and fall within federal poverty guidelines  [facility]    Provides dental care in addition to primary medical care, nutritional counseling, and pharmacy:  Nurse, mental healthCleaning  Sealants, fillings, crowns  Extractions                  [phone] [facility], [address] [place], [place]  [phone] [address] [facility], [address] [place], [place]  [phone] [facility] [place], [place]  [phone] [facility], [address] [place], [place]  [phone] [facility] [address] [place], [place] Accepts [place], [place], most insurance.  Also provides services available to all with fees adjusted based on ability to pay.    [facility]  Cleaning  Tooth brushing/flossing instruction  Sealants, fillings, crowns  Extractions  Emergency treatment Hours: Tuesdays, Thursdays, and Fridays from 8 am to 5 pm by appointment only. [phone] [address] [place], [place] [phone] [facility] [place] residents with Medicaid (depending on eligibility) and children with [facility] Health Choice - call for more information.  [facility]  Extractions only  Hours: 2nd and 4th Thursday of each month from 6:30 am - 9 am.   [phone] [place], [place] [address] Ages 918 and older only.  Patients are seen on a first come, first served basis.  [employer]  [employer]  Fillings  Extractions  Orthodontics  Endodontics  Implants/Crowns/Bridges  Complete and partial dentures [phone] [place], [place] Patients must complete an application for  services.  There is often a waiting list.

## 2015-11-18 NOTE — ED Notes (Signed)
Patient states broken tooth to L lower molar.  Patient states abscess came up x 2 days ago.   Patient denies any drainage, just swelling.

## 2017-04-08 ENCOUNTER — Emergency Department (HOSPITAL_COMMUNITY)
Admission: EM | Admit: 2017-04-08 | Discharge: 2017-04-08 | Disposition: A | Discharge status: Home / Self Care | Payer: No Typology Code available for payment source | Attending: Emergency Medicine | Admitting: Emergency Medicine

## 2017-04-08 DIAGNOSIS — M545 Low back pain, unspecified: Secondary | ICD-10-CM

## 2017-04-08 DIAGNOSIS — I10 Essential (primary) hypertension: Secondary | ICD-10-CM | POA: Diagnosis not present

## 2017-04-08 DIAGNOSIS — F17290 Nicotine dependence, other tobacco product, uncomplicated: Secondary | ICD-10-CM | POA: Diagnosis not present

## 2017-04-08 DIAGNOSIS — Y9389 Activity, other specified: Secondary | ICD-10-CM | POA: Insufficient documentation

## 2017-04-08 DIAGNOSIS — M25511 Pain in right shoulder: Secondary | ICD-10-CM | POA: Insufficient documentation

## 2017-04-08 DIAGNOSIS — Y998 Other external cause status: Secondary | ICD-10-CM | POA: Insufficient documentation

## 2017-04-08 DIAGNOSIS — Z79899 Other long term (current) drug therapy: Secondary | ICD-10-CM | POA: Insufficient documentation

## 2017-04-08 DIAGNOSIS — Z760 Encounter for issue of repeat prescription: Secondary | ICD-10-CM | POA: Diagnosis not present

## 2017-04-08 MED ORDER — LISINOPRIL 10 MG PO TABS: 10.0000 mg | ORAL_TABLET | Freq: Every day | ORAL | 0 refills | Status: DC

## 2017-04-08 MED ORDER — CYCLOBENZAPRINE HCL 10 MG PO TABS: 10.0000 mg | ORAL_TABLET | Freq: Every evening | ORAL | 0 refills | Status: DC | PRN
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
## 2017-04-08 NOTE — Discharge Instructions (Signed)
Take Ibuprofen three times daily for the next week for pain. Take this medicine with food. Take muscle relaxer at bedtime to help you sleep. This medicine makes you drowsy so do not take before driving or work Use a heating pad for sore muscles - use for 20 minutes several times a day Return for worsening symptoms
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
## 2020-01-20 ENCOUNTER — Encounter: Payer: Self-pay | Admitting: Internal Medicine

## 2020-01-20 ENCOUNTER — Ambulatory Visit: Payer: Self-pay | Admitting: Internal Medicine

## 2020-01-20 DIAGNOSIS — Z6841 Body Mass Index (BMI) 40.0 and over, adult: Secondary | ICD-10-CM

## 2020-01-20 DIAGNOSIS — I10 Essential (primary) hypertension: Secondary | ICD-10-CM

## 2020-01-20 DIAGNOSIS — E66813 Obesity, class 3: Secondary | ICD-10-CM

## 2020-01-20 MED ORDER — LISINOPRIL-HYDROCHLOROTHIAZIDE 20-25 MG PO TABS: 1.0000 | ORAL_TABLET | Freq: Every day | ORAL | 11 refills | Status: DC

## 2020-01-20 NOTE — Progress Notes (Signed)
Subjective:    Patient ID: Cindy Daniel, female   DOB: July 22, 1976, 44 y.o.   MRN: [id number]   HPI   Here to establish  1.  Hypertension:  Diagnosed 2010.  Has only taken Lisinopril 10 mg, but when she is heavier, does not seem to work.  No problems with the medication other than efficacy.  Later, shares she was taking Lisinopril 5 mg at night and 10 mg with 12.5 mg of HCTZ in the morning in past.  No swelling of ankles and feet.    No outpatient medications have been marked as taking for the 01/20/20 encounter (Office Visit) with Julieanne Manson, MD.   No Known Allergies   Past Medical History:  Diagnosis Date  . Hypertension 2010  . Obesity     History reviewed. No pertinent surgical history.  Family History  Problem Relation Age of Onset  . Hypertension Mother   . Cancer Father        She believes he has some form of CA  . ADD / ADHD Daughter   . Asthma Son     Social History   Socioeconomic History  . Marital status: Single    Spouse name: Not on file  . Number of children: 4  . Years of education: Not on file  . Highest education level: Associate degree: academic program  Occupational History  . Occupation: Clinical biochemist with [employer]  Tobacco Use  . Smoking status: Current Every Day Smoker    Packs/day: 0.01    Types: Cigars  . Smokeless tobacco: Never Used  Substance and Sexual Activity  . Alcohol use: Yes    Comment: occ  . Drug use: No  . Sexual activity: Yes    Birth control/protection: Injection  Other Topics Concern  . Not on file  Social History Narrative   Lives at home with her aunt's young adult children (3 and 68) and her 78 yo and grandchild who is 22 yo.  (Daughter's child)   Social Determinants of Health   Financial Resource Strain:   . Difficulty of Paying Living Expenses: Not on file  Food Insecurity:   . Worried About Programme researcher, broadcasting/film/video in the Last Year: Not on file  . Ran Out of Food in the Last Year: Not on  file  Transportation Needs:   . Lack of Transportation (Medical): Not on file  . Lack of Transportation (Non-Medical): Not on file  Physical Activity:   . Days of Exercise per Week: Not on file  . Minutes of Exercise per Session: Not on file  Stress:   . Feeling of Stress : Not on file  Social Connections:   . Frequency of Communication with Friends and Family: Not on file  . Frequency of Social Gatherings with Friends and Family: Not on file  . Attends Religious Services: Not on file  . Active Member of Clubs or Organizations: Not on file  . Attends Banker Meetings: Not on file  . Marital Status: Not on file  Intimate Partner Violence:   . Fear of Current or Ex-Partner: Not on file  . Emotionally Abused: Not on file  . Physically Abused: Not on file  . Sexually Abused: Not on file      Review of Systems    Objective:   BP (!) 220/102 (BP Location: Right Arm, Patient Position: Sitting, Cuff Size: Normal)   Pulse 92   Resp 12   Ht 5\' 5"  (1.651  m)   Wt 265 lb (120.2 kg)   BMI 44.10 kg/m   Physical Exam  Obese, NAD HEENT:  PERRL, EOMI, TMs pearly gray, throat without injection Neck:  Supple, No adenopathy, no thyromegaly Chest:  CTA CV:  RRR with normal S1 and S2, No S3, S4 or murmur.  No carotid bruit.  Carotid, radial and DP pulses normal and equal Abd:  S, NT, No HSM or mass, + BS LE:  No edema.   Assessment & Plan   1.  Hypertension:  Start Lisinopril 20 mg with 25 mg of HCTZ daily in morning.   To return for fasting labs in next week.  FLP, CBC, CMP with bp and pulse then as well.  2.  Obesity:  To work on diet and exercise--small doable goals.

## 2020-02-03 ENCOUNTER — Other Ambulatory Visit: Payer: Self-pay

## 2020-02-03 ENCOUNTER — Ambulatory Visit: Payer: Self-pay

## 2020-02-03 VITALS — BP 142/92 | HR 78

## 2020-02-03 DIAGNOSIS — I10 Essential (primary) hypertension: Secondary | ICD-10-CM

## 2020-02-03 DIAGNOSIS — Z13220 Encounter for screening for lipoid disorders: Secondary | ICD-10-CM

## 2020-02-03 NOTE — Progress Notes (Signed)
Patient came in for BP and pulse check. Patient BP is better but not azt goal. Informed patient to continue current dose and will recheck in 1 week when come in for 2nd dose of covid vaccine. Patient verbalized understanding.

## 2020-02-04 LAB — CBC WITH DIFFERENTIAL/PLATELET
Basophils Absolute: 0 10*3/uL (ref 0.0–0.2)
Basos: 0 %
EOS (ABSOLUTE): 0.2 10*3/uL (ref 0.0–0.4)
Eos: 4 %
Hematocrit: 39.4 % (ref 34.0–46.6)
Hemoglobin: 13 g/dL (ref 11.1–15.9)
Immature Grans (Abs): 0 10*3/uL (ref 0.0–0.1)
Immature Granulocytes: 0 %
Lymphocytes Absolute: 1.8 10*3/uL (ref 0.7–3.1)
Lymphs: 39 %
MCH: 30.5 pg (ref 26.6–33.0)
MCHC: 33 g/dL (ref 31.5–35.7)
MCV: 93 fL (ref 79–97)
Monocytes Absolute: 0.3 10*3/uL (ref 0.1–0.9)
Monocytes: 5 %
Neutrophils Absolute: 2.4 10*3/uL (ref 1.4–7.0)
Neutrophils: 52 %
Platelets: 332 10*3/uL (ref 150–450)
RBC: 4.26 x10E6/uL (ref 3.77–5.28)
RDW: 12.5 % (ref 11.7–15.4)
WBC: 4.6 10*3/uL (ref 3.4–10.8)

## 2020-02-04 LAB — COMPREHENSIVE METABOLIC PANEL
ALT: 14 IU/L (ref 0–32)
AST: 14 IU/L (ref 0–40)
Albumin/Globulin Ratio: 1.4 (ref 1.2–2.2)
Albumin: 4.4 g/dL (ref 3.8–4.8)
Alkaline Phosphatase: 72 IU/L (ref 48–121)
BUN/Creatinine Ratio: 15 (ref 9–23)
BUN: 17 mg/dL (ref 6–24)
Bilirubin Total: 0.2 mg/dL (ref 0.0–1.2)
CO2: 25 mmol/L (ref 20–29)
Calcium: 9.6 mg/dL (ref 8.7–10.2)
Chloride: 101 mmol/L (ref 96–106)
Creatinine, Ser: 1.17 mg/dL — ABNORMAL HIGH (ref 0.57–1.00)
GFR calc Af Amer: 67 mL/min/{1.73_m2} (ref >59)
GFR calc non Af Amer: 58 mL/min/{1.73_m2} — ABNORMAL LOW (ref >59)
Globulin, Total: 3.1 g/dL (ref 1.5–4.5)
Glucose: 93 mg/dL (ref 65–99)
Potassium: 4.4 mmol/L (ref 3.5–5.2)
Sodium: 142 mmol/L (ref 134–144)
Total Protein: 7.5 g/dL (ref 6.0–8.5)

## 2020-02-04 LAB — LIPID PANEL W/O CHOL/HDL RATIO
Cholesterol, Total: 188 mg/dL (ref 100–199)
HDL: 41 mg/dL (ref >39)
LDL Chol Calc (NIH): 130 mg/dL — ABNORMAL HIGH (ref 0–99)
Triglycerides: 92 mg/dL (ref 0–149)
VLDL Cholesterol Cal: 17 mg/dL (ref 5–40)

## 2020-04-21 ENCOUNTER — Encounter: Payer: Self-pay | Admitting: Internal Medicine

## 2020-04-21 ENCOUNTER — Ambulatory Visit: Payer: Self-pay | Admitting: Internal Medicine

## 2020-04-21 ENCOUNTER — Encounter: Payer: Self-pay | Admitting: Clinical

## 2020-04-21 VITALS — BP 134/80 | HR 70 | Ht 64.0 in | Wt 259.0 lb

## 2020-04-21 DIAGNOSIS — I10 Essential (primary) hypertension: Secondary | ICD-10-CM

## 2020-04-21 DIAGNOSIS — Z6841 Body Mass Index (BMI) 40.0 and over, adult: Secondary | ICD-10-CM

## 2020-04-21 DIAGNOSIS — Z716 Tobacco abuse counseling: Secondary | ICD-10-CM

## 2020-04-21 DIAGNOSIS — Z720 Tobacco use: Secondary | ICD-10-CM

## 2020-04-21 DIAGNOSIS — R7989 Other specified abnormal findings of blood chemistry: Secondary | ICD-10-CM

## 2020-04-21 NOTE — Progress Notes (Signed)
    Subjective:    Patient ID: Cindy Daniel, female   DOB: 1975/10/26, 44 y.o.   MRN: [id number]   HPI   1.  Hypertension:  Feels better now that her BP is controlled.  More energy and strength.  Has lost 6 lbs since June. Doing a 12 hour fast from 8 p.m. to 8 a.m. Discussed diet  2.  Tobacco Abuse:  Would like to quit, but needs more time.  Smokes 1 Black and Mild cigar.  Only really smokes after work as a stress reliever.  3.  Loose stools with fruit and dairy.  Does at times have formed stool, no blood or melena.  Has been stressed --mainly just with personal issues, taking care of kids, etc.  Would consider counseling.  Current Meds  Medication Sig  . lisinopril-hydrochlorothiazide (ZESTORETIC) 20-25 MG tablet Take 1 tablet by mouth daily.   No Known Allergies   Review of Systems    Objective:   BP 134/80 (BP Location: Left Arm, Patient Position: Sitting, Cuff Size: Normal)   Pulse 70   Ht 5\' 4"  (1.626 m)   Wt 259 lb (117.5 kg)   BMI 44.46 kg/m   Physical Exam  NAD Lungs:  CTA CV:  RRR without murmur or rub.  Radial and DP pulses normal and equal LE:  No edema   Assessment & Plan  1.  Hypertension:  Much improved control with increase in Lisinopril/HCTZ.  Had an elevated creatinine back in July.  BMP to recheck  2.  Obesity:  Discussed dietary changes and increasing daily physical activity.  3.  IBS/Stress:  Warm hand off to August, LCSW for counseling to decrease stress and stop smoking.  Discussed gradual increase of fiber in diet to improve the former  4.  Tobacco use:  Asked her to work with Danton Clap for this as well.  Recommended nicotine gum or lozenges instead.  Not clear she is motivated to do so at this time.

## 2020-04-21 NOTE — Progress Notes (Signed)
LCSW met with patient as a warm hand off with provider. LCSW introduced self to patient. LCSW informed concerns presented with MD regarding smoking and stress. LCSW informed of counseling service with LCSW and MSW interns. LCSW informed of cost. LCSW informed service is available and if she is interested. LCSW informed if cost is too much for LCSW or w/MSW intern to inform LCSW to discuss other free sites. Patient reported she will think about it and accepted LCSW card. Patient agreed to call if needed.

## 2020-04-22 LAB — BASIC METABOLIC PANEL
BUN/Creatinine Ratio: 14 (ref 9–23)
BUN: 14 mg/dL (ref 6–24)
CO2: 24 mmol/L (ref 20–29)
Calcium: 9.5 mg/dL (ref 8.7–10.2)
Chloride: 100 mmol/L (ref 96–106)
Creatinine, Ser: 1 mg/dL (ref 0.57–1.00)
GFR calc Af Amer: 81 mL/min/{1.73_m2} (ref >59)
GFR calc non Af Amer: 70 mL/min/{1.73_m2} (ref >59)
Glucose: 80 mg/dL (ref 65–99)
Potassium: 4.1 mmol/L (ref 3.5–5.2)
Sodium: 136 mmol/L (ref 134–144)

## 2020-10-20 ENCOUNTER — Ambulatory Visit: Payer: Self-pay | Admitting: Internal Medicine

## 2020-11-18 ENCOUNTER — Encounter: Payer: Self-pay | Admitting: Internal Medicine

## 2020-11-18 ENCOUNTER — Other Ambulatory Visit: Payer: Self-pay

## 2020-11-18 ENCOUNTER — Ambulatory Visit: Payer: Self-pay | Admitting: Internal Medicine

## 2020-11-18 VITALS — BP 138/92 | HR 72 | Resp 18 | Ht 64.5 in | Wt 253.5 lb

## 2020-11-18 DIAGNOSIS — J302 Other seasonal allergic rhinitis: Secondary | ICD-10-CM

## 2020-11-18 DIAGNOSIS — Z6841 Body Mass Index (BMI) 40.0 and over, adult: Secondary | ICD-10-CM

## 2020-11-18 DIAGNOSIS — I10 Essential (primary) hypertension: Secondary | ICD-10-CM

## 2020-11-18 DIAGNOSIS — Z720 Tobacco use: Secondary | ICD-10-CM

## 2020-11-18 MED ORDER — FEXOFENADINE HCL 180 MG PO TABS: 180.0000 mg | ORAL_TABLET | Freq: Every day | ORAL | 11 refills | Status: AC

## 2020-11-18 MED ORDER — FLUTICASONE PROPIONATE 50 MCG/ACT NA SUSP: 2.0000 | Freq: Every day | NASAL | 11 refills | Status: DC

## 2020-11-18 MED ORDER — LISINOPRIL-HYDROCHLOROTHIAZIDE 20-25 MG PO TABS: 1.0000 | ORAL_TABLET | Freq: Every day | ORAL | 11 refills | Status: DC

## 2020-11-18 NOTE — Patient Instructions (Signed)

## 2020-11-18 NOTE — Progress Notes (Signed)
    Subjective:    Patient ID: Cindy Daniel, female   DOB: Dec 20, 1975, 45 y.o.   MRN: [id number]   HPI   1.  Hypertension:   Takes medication daily.  Is taking Zumba twice weekly.  Does squats at desk when sitting.  Weight down a bit.    2.  HM:  Gets GU screening from PHD.  Has mammogram scheduled in 2 weeks.  Gets Depo Provera shot there. Feels she has had Td in past 10 years--likely at [facility].   Had Covid vaccine--Pfizer.  Has mammogram in 2 weeks, however.   States had negative HIV and Hep C in past 3 years.   3.  Runny eyes and nose.  Sinuses also congested.  Was using unknown dollar tree OTC allergy medication.  Refuses eye drops.  4.  Tobacco:  continues to smoke cigarettes and black and milds intermittently--not daily.  She cannot say what would make her want to quit completely.  Current Meds  Medication Sig   lisinopril-hydrochlorothiazide (ZESTORETIC) 20-25 MG tablet Take 1 tablet by mouth daily.   medroxyPROGESTERone (DEPO-PROVERA) 150 MG/ML injection Inject 150 mg into the muscle every 3 (three) months. [facility]    No Known Allergies   Review of Systems    Objective:   BP (!) 138/92 (BP Location: Left Arm, Patient Position: Sitting, Cuff Size: Large)   Pulse 72   Resp 18   Ht 5' 4.5" (1.638 m)   Wt 253 lb 8 oz (115 kg)   BMI 42.84 kg/m   Physical Exam NAD HEENT:  PERRL, EOMI, conjunctivae without injection, TMs pearly gray, throat without injection.  Right nasal mucosa swollen with clear discharge.  NT over frontal and maxillary sinuses. Neck:  Supple, No adenopathy Chest:  CTA CV:  RRR with normal S1 and S2, No S3, S4 or murmur.  Radial pulses normal and equal Abd:  S, NT, No HSM or mass.  + BS LE:  No edema.   Assessment & Plan    Hypertension and obesity:  She would like to continue to work on lifestyle changes to get BP down.  Discussed making regular goals for physical activity and dietary changes. Repeat BP with nurse in 1 month.  Continue  Lisinopril/HCTZ at current dosing.   Fasting labs in September before follow up with me.    2.  Seasonal Allergies:  Flonase and Fexofenadine.  To call if no improvement.   3.  Tobacco abuse:  She feels she can stop when she would like to.  Not ready yet.

## 2020-12-28 ENCOUNTER — Ambulatory Visit: Payer: Self-pay | Admitting: Internal Medicine

## 2020-12-28 ENCOUNTER — Encounter: Payer: Self-pay | Admitting: Internal Medicine

## 2020-12-28 ENCOUNTER — Other Ambulatory Visit: Payer: Self-pay

## 2020-12-28 VITALS — BP 145/80

## 2020-12-28 DIAGNOSIS — I10 Essential (primary) hypertension: Secondary | ICD-10-CM

## 2020-12-28 MED ORDER — LISINOPRIL 10 MG PO TABS: ORAL_TABLET | ORAL | 3 refills | Status: DC

## 2020-12-28 NOTE — Progress Notes (Signed)
    Subjective:    Patient ID: Cindy Daniel, female   DOB: February 22, 1976, 45 y.o.   MRN: [id number]   HPI   Here for BP check.  Taking Lisinopril/HCTZ regularly.  May have had a rash to Amlodipine in past.   Walking regularly.  Current Meds  Medication Sig  . lisinopril (ZESTRIL) 10 MG tablet 1 tab by mouth daily with Lisinopril/HCTZ   No Known Allergies   Review of Systems    Objective:     Physical Exam   Assessment & Plan   Hypertension:  May have had allergic rxn to Amlodipine.  Will increase Lisinopril to 30 mg daily and HCTZ remains at 25 mg daily.  Will take at same time.   BP check with BMP in 1 month.

## 2021-02-01 ENCOUNTER — Other Ambulatory Visit: Payer: Self-pay

## 2021-02-03 ENCOUNTER — Other Ambulatory Visit: Payer: Self-pay

## 2021-02-03 VITALS — BP 114/80

## 2021-02-03 DIAGNOSIS — R7989 Other specified abnormal findings of blood chemistry: Secondary | ICD-10-CM

## 2021-02-03 DIAGNOSIS — I10 Essential (primary) hypertension: Secondary | ICD-10-CM

## 2021-02-04 LAB — BASIC METABOLIC PANEL
BUN/Creatinine Ratio: 15 (ref 9–23)
BUN: 16 mg/dL (ref 6–24)
CO2: 23 mmol/L (ref 20–29)
Calcium: 9.7 mg/dL (ref 8.7–10.2)
Chloride: 103 mmol/L (ref 96–106)
Creatinine, Ser: 1.09 mg/dL — ABNORMAL HIGH (ref 0.57–1.00)
Glucose: 77 mg/dL (ref 65–99)
Potassium: 3.6 mmol/L (ref 3.5–5.2)
Sodium: 142 mmol/L (ref 134–144)
eGFR: 65 mL/min/{1.73_m2} (ref >59)

## 2021-04-19 ENCOUNTER — Other Ambulatory Visit: Payer: Self-pay | Admitting: Internal Medicine

## 2021-04-19 ENCOUNTER — Other Ambulatory Visit: Payer: Self-pay

## 2021-04-19 DIAGNOSIS — Z79899 Other long term (current) drug therapy: Secondary | ICD-10-CM

## 2021-04-19 DIAGNOSIS — Z13220 Encounter for screening for lipoid disorders: Secondary | ICD-10-CM

## 2021-04-20 LAB — COMPREHENSIVE METABOLIC PANEL
ALT: 14 IU/L (ref 0–32)
AST: 13 IU/L (ref 0–40)
Albumin/Globulin Ratio: 1.4 (ref 1.2–2.2)
Albumin: 3.9 g/dL (ref 3.8–4.8)
Alkaline Phosphatase: 63 IU/L (ref 44–121)
BUN/Creatinine Ratio: 12 (ref 9–23)
BUN: 13 mg/dL (ref 6–24)
Bilirubin Total: 0.3 mg/dL (ref 0.0–1.2)
CO2: 20 mmol/L (ref 20–29)
Calcium: 9.3 mg/dL (ref 8.7–10.2)
Chloride: 104 mmol/L (ref 96–106)
Creatinine, Ser: 1.1 mg/dL — ABNORMAL HIGH (ref 0.57–1.00)
Globulin, Total: 2.8 g/dL (ref 1.5–4.5)
Glucose: 93 mg/dL (ref 70–99)
Potassium: 3.6 mmol/L (ref 3.5–5.2)
Sodium: 146 mmol/L — ABNORMAL HIGH (ref 134–144)
Total Protein: 6.7 g/dL (ref 6.0–8.5)
eGFR: 64 mL/min/{1.73_m2} (ref >59)

## 2021-04-20 LAB — LIPID PANEL W/O CHOL/HDL RATIO
Cholesterol, Total: 184 mg/dL (ref 100–199)
HDL: 35 mg/dL — ABNORMAL LOW (ref >39)
LDL Chol Calc (NIH): 130 mg/dL — ABNORMAL HIGH (ref 0–99)
Triglycerides: 104 mg/dL (ref 0–149)
VLDL Cholesterol Cal: 19 mg/dL (ref 5–40)

## 2021-04-20 LAB — CBC WITH DIFFERENTIAL/PLATELET
Basophils Absolute: 0 10*3/uL (ref 0.0–0.2)
Basos: 0 %
EOS (ABSOLUTE): 0.1 10*3/uL (ref 0.0–0.4)
Eos: 2 %
Hematocrit: 37 % (ref 34.0–46.6)
Hemoglobin: 12.5 g/dL (ref 11.1–15.9)
Immature Grans (Abs): 0 10*3/uL (ref 0.0–0.1)
Immature Granulocytes: 0 %
Lymphocytes Absolute: 2.1 10*3/uL (ref 0.7–3.1)
Lymphs: 32 %
MCH: 30.5 pg (ref 26.6–33.0)
MCHC: 33.8 g/dL (ref 31.5–35.7)
MCV: 90 fL (ref 79–97)
Monocytes Absolute: 0.4 10*3/uL (ref 0.1–0.9)
Monocytes: 6 %
Neutrophils Absolute: 3.9 10*3/uL (ref 1.4–7.0)
Neutrophils: 60 %
Platelets: 329 10*3/uL (ref 150–450)
RBC: 4.1 x10E6/uL (ref 3.77–5.28)
RDW: 12.6 % (ref 11.7–15.4)
WBC: 6.5 10*3/uL (ref 3.4–10.8)

## 2021-04-21 ENCOUNTER — Ambulatory Visit: Payer: Self-pay | Admitting: Internal Medicine

## 2021-05-18 ENCOUNTER — Other Ambulatory Visit: Payer: Self-pay

## 2021-05-18 ENCOUNTER — Ambulatory Visit: Payer: Self-pay | Admitting: Internal Medicine

## 2021-05-18 ENCOUNTER — Encounter: Payer: Self-pay | Admitting: Internal Medicine

## 2021-05-18 VITALS — BP 150/92 | HR 100 | Resp 20 | Ht 64.5 in | Wt 264.5 lb

## 2021-05-18 DIAGNOSIS — I10 Essential (primary) hypertension: Secondary | ICD-10-CM

## 2021-05-18 DIAGNOSIS — Z6841 Body Mass Index (BMI) 40.0 and over, adult: Secondary | ICD-10-CM

## 2021-05-18 DIAGNOSIS — Z23 Encounter for immunization: Secondary | ICD-10-CM

## 2021-05-18 DIAGNOSIS — E7800 Pure hypercholesterolemia, unspecified: Secondary | ICD-10-CM

## 2021-05-18 DIAGNOSIS — R7989 Other specified abnormal findings of blood chemistry: Secondary | ICD-10-CM

## 2021-05-18 NOTE — Progress Notes (Signed)
    Subjective:    Patient ID: Cindy Daniel, female   DOB: 1975/12/08, 45 y.o.   MRN: [id number]   HPI   Hypertension:  States BP was fine when obtained Depo shot on Oct 6th and also here in July.  Weight is up 11 lbs.  From April.  Not clear she is making any goals.  Eats a lot of desserts which she hides for herself.  We did add another 10 mg of Lisinopril in interim from last visit.  2.  Obesity:  as above.  Discussed making her two goals of no snacks at home--to buy individually and reward once weekly and no sweeteners in water.    3.  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    Current Meds  Medication Sig   lisinopril (ZESTRIL) 10 MG tablet 1 tab by mouth daily with Lisinopril/HCTZ   lisinopril-hydrochlorothiazide (ZESTORETIC) 20-25 MG tablet Take 1 tablet by mouth daily.   medroxyPROGESTERone (DEPO-PROVERA) 150 MG/ML injection Inject 150 mg into the muscle every 3 (three) months. [facility]   No Known Allergies   Review of Systems    Objective:   BP (!) 150/92 (BP Location: Right Arm, Patient Position: Sitting, Cuff Size: Normal)   Pulse 100   Resp 20   Ht 5' 4.5" (1.638 m)   Wt 264 lb 8 oz (120 kg)   BMI 44.70 kg/m   Physical Exam NAD Lungs:  CTA CV:  RRR without murmur or rub.  Radial pulses normal and equal LE:  No edema.   Assessment & Plan    Hypertension and obesity:  Not controlled.  discussed again, making weekly specific goals that build on each other for both diet and physical activity.   Repeat BP check with CMA in 2 weeks.  2.  Stress:  encouraged daily physical activity to help with control of stress  3.  HM:  Pfizer bivalent covid booster.  4.  Elevated Creatinine:  stable.  Has been noted in past.

## 2021-05-18 NOTE — Patient Instructions (Signed)
Make weekly goals!!!

## 2021-06-04 ENCOUNTER — Other Ambulatory Visit: Payer: Self-pay

## 2021-06-04 ENCOUNTER — Ambulatory Visit: Payer: Self-pay

## 2021-06-04 VITALS — BP 122/82

## 2021-06-04 DIAGNOSIS — Z0130 Encounter for examination of blood pressure without abnormal findings: Secondary | ICD-10-CM

## 2021-06-04 NOTE — Progress Notes (Signed)
Has no questions or concerns regarding bp medication. Patient has been taking bp medications consistently

## 2021-07-22 ENCOUNTER — Other Ambulatory Visit: Payer: Self-pay

## 2021-07-22 MED ORDER — LISINOPRIL-HYDROCHLOROTHIAZIDE 20-25 MG PO TABS: 1.0000 | ORAL_TABLET | Freq: Every day | ORAL | 9 refills | Status: DC

## 2021-07-22 MED ORDER — LISINOPRIL 10 MG PO TABS
ORAL_TABLET | ORAL | 9 refills | Status: DC
Start: 2021-07-22 — End: 2022-06-29

## 2021-11-15 ENCOUNTER — Ambulatory Visit: Payer: BC Managed Care – PPO | Admitting: Internal Medicine

## 2022-01-03 ENCOUNTER — Ambulatory Visit: Payer: BC Managed Care – PPO | Admitting: Internal Medicine

## 2022-01-27 ENCOUNTER — Ambulatory Visit: Payer: BC Managed Care – PPO | Admitting: Internal Medicine

## 2022-01-27 ENCOUNTER — Encounter: Payer: Self-pay | Admitting: Internal Medicine

## 2022-01-27 VITALS — BP 140/98 | HR 92 | Resp 14 | Ht 65.5 in | Wt 267.0 lb

## 2022-01-27 DIAGNOSIS — I10 Essential (primary) hypertension: Secondary | ICD-10-CM

## 2022-01-27 DIAGNOSIS — R7989 Other specified abnormal findings of blood chemistry: Secondary | ICD-10-CM

## 2022-01-27 DIAGNOSIS — Z6841 Body Mass Index (BMI) 40.0 and over, adult: Secondary | ICD-10-CM | POA: Diagnosis not present

## 2022-01-27 MED ORDER — OYSTER SHELL CALCIUM/D3 500-5 MG-MCG PO TABS: ORAL_TABLET | ORAL | Status: AC

## 2022-01-27 NOTE — Progress Notes (Signed)
    Subjective:    Patient ID: Cindy Daniel, female   DOB: July 30, 1975, 46 y.o.   MRN: [id number]   HPI   HM:  Last mammogram performed over a year ago.  No family history of breast cancer. She gets this ordered during her gyn exams at [facility].  Last pap was 2 years ago and normal.  Does have a history of abnormal pap years ago with prolonged follow up and normalization without treatment.  Has never performed FIT testing nor undergone exam with colonoscopy.  No family history of colon cancer.  Drinks 1 cup almond milk daily--causes loose stools, however, and does not want to drink more.  Takes a calcium and Vitamin D tab daily as well.  She has not increased her physical activity.  Did not make goals or gradually increase her activity.    Has had    2.  Obesity:  as above, did not make goals with increments.  States has cut back portion sizes.  Stopped eating a lot of fried foods.  Her job is very limiting for her.  Hours change day to day and hours added on as she is working.  Has been told she will get a day off, then they add a meeting end of day.    3.  Hypertension:  BP was fine when in for last BP check.  BP recheck today was similar to original number.  Current Meds  Medication Sig   lisinopril (ZESTRIL) 10 MG tablet 1 tab by mouth daily with Lisinopril/HCTZ   lisinopril-hydrochlorothiazide (ZESTORETIC) 20-25 MG tablet Take 1 tablet by mouth daily.   medroxyPROGESTERone (DEPO-PROVERA) 150 MG/ML injection Inject 150 mg into the muscle every 3 (three) months. [facility]   No Known Allergies   Review of Systems    Objective:   BP (!) 140/98 (BP Location: Left Arm, Patient Position: Sitting, Cuff Size: Large)   Pulse 92   Resp 14   Ht 5' 5.5" (1.664 m)   Wt 267 lb (121.1 kg)   BMI 43.76 kg/m   Physical Exam NAD Lungs:  CTA CV:  RRR with normal S1 and S2, No S3, S4 or murmur.  No carotid bruits.  Carotid, radial and DP pulses normal and equal. Abd:  S, NT, + BS LE:  No  edema.   Assessment & Plan    Hypertension and obesity:  BP not at goal today.  Not clear if anxiety when in for exam.  Has been fine when just in for BP check.  Again, encouraged more goal setting.  Repeat BP check in 2 weeks.    2  Elevated Creatinine:  BMP and urine microalbumin/crea with BP check.

## 2022-04-22 ENCOUNTER — Telehealth: Payer: Self-pay

## 2022-04-22 NOTE — Telephone Encounter (Signed)
Telephoned patient and left voice message with [facility] (scholarship) contact information.

## 2022-06-29 ENCOUNTER — Other Ambulatory Visit: Payer: Self-pay

## 2022-06-29 MED ORDER — LISINOPRIL 10 MG PO TABS: ORAL_TABLET | ORAL | 7 refills | Status: DC

## 2022-06-29 MED ORDER — LISINOPRIL-HYDROCHLOROTHIAZIDE 20-25 MG PO TABS: 1.0000 | ORAL_TABLET | Freq: Every day | ORAL | 7 refills | Status: DC

## 2022-07-27 NOTE — Telephone Encounter (Signed)
Attempted to call patient, "call cannot be completed at this time." 

## 2022-08-04 ENCOUNTER — Ambulatory Visit: Payer: BC Managed Care – PPO | Admitting: Internal Medicine

## 2022-09-07 ENCOUNTER — Ambulatory Visit: Payer: BC Managed Care – PPO | Admitting: Internal Medicine

## 2022-09-07 ENCOUNTER — Encounter: Payer: Self-pay | Admitting: Internal Medicine

## 2022-09-07 VITALS — BP 180/100 | HR 88 | Ht 65.5 in | Wt 262.0 lb

## 2022-09-07 DIAGNOSIS — E7800 Pure hypercholesterolemia, unspecified: Secondary | ICD-10-CM | POA: Diagnosis not present

## 2022-09-07 DIAGNOSIS — Z79899 Other long term (current) drug therapy: Secondary | ICD-10-CM

## 2022-09-07 DIAGNOSIS — I10 Essential (primary) hypertension: Secondary | ICD-10-CM

## 2022-09-07 DIAGNOSIS — Z23 Encounter for immunization: Secondary | ICD-10-CM | POA: Diagnosis not present

## 2022-09-07 DIAGNOSIS — R7989 Other specified abnormal findings of blood chemistry: Secondary | ICD-10-CM | POA: Diagnosis not present

## 2022-09-07 DIAGNOSIS — R609 Edema, unspecified: Secondary | ICD-10-CM | POA: Diagnosis not present

## 2022-09-07 MED ORDER — CARVEDILOL 3.125 MG PO TABS: 3.1250 mg | ORAL_TABLET | Freq: Two times a day (BID) | ORAL | 11 refills | Status: DC

## 2022-09-07 NOTE — Progress Notes (Signed)
    Subjective:    Patient ID: Cindy Daniel, female   DOB: Sep 04, 1975, 47 y.o.   MRN: [id number]   HPI   Hypertension:  States taking Lisinopril and Lisinopril HCTZ.  Was stressed coming in today as has to quickly make arrangements to pick up child from school.   2.  Obesity:  Skipping lunch half the days--drinks a lot of water.  She has recently cut back on serving size and evening meal.   3.  HM:  Immunizations:  has not had her last Hep A and B vaccinations.  She had Tdap in 2017.  Found in [place].  Will be having public health dept send her for mammogram.  States she has done at [facility], but do not see any results for that in chart.     4.  Thick tongue on exam:  Has had tonsils and adenoids out at age 32 yo.  Does snore.  Has never been told she stops breathing  Current Meds  Medication Sig   calcium-vitamin D (OSCAL WITH D) 500-5 MG-MCG tablet 1 tab by mouth daily.   lisinopril (ZESTRIL) 10 MG tablet 1 tab by mouth daily with Lisinopril/HCTZ   lisinopril-hydrochlorothiazide (ZESTORETIC) 20-25 MG tablet Take 1 tablet by mouth daily.   medroxyPROGESTERone (DEPO-PROVERA) 150 MG/ML injection Inject 150 mg into the muscle every 3 (three) months. [facility]     No Known Allergies   Review of Systems    Objective:   BP (!) 180/100 (BP Location: Left Arm, Patient Position: Sitting, Cuff Size: Normal)   Pulse 88   Ht 5' 5.5" (1.664 m)   Wt 262 lb (118.8 kg)   BMI 42.94 kg/m   Physical Exam NAD, appears tired HEENT:  PERRL, EOMI, TMs pearly gray, throat without injection, thick tongue--hard to see posterior pharynx Neck:  Supple, No adenopathy, no thyromegaly Chest:  CTA CV:  RRR with normal S1 andS2, No S3, S4 or murmur.  No carotid bruits.  Carotid, radial and DP pulses normal and equal Abd:  S, NT, No HSM or mass, + BS LE:  bilateral mild pitting edema to pretib area.      Assessment & Plan    Hypertension and obesity:  not well controlled still.  Add Carvedilol  3.125 mg twice daily.  BP and pulse in 2 weeks.  CBC, CMP, TSH,today.  Discussed continuing to work on diet and physical activity  2.  Snoring:  she is not interested in obtaining sleep study.  Cannot get reason why.  3.  Elevated Creatinine:  CMP.  She urinated when came in so could not get urine sample for urine microalbumin/crea  4.  Pitting edema.  Discussed elevating legs when sitting.  Continue HCTZ.  CMP, TSH  5.  HM:  Gyn follow up with PHD.  Encouraged her to make sure she is getting mammogram.  Hep A and Hep B today.  6.  Hypercholesterolemia:  nonfasting lipids today.

## 2022-09-08 LAB — COMPREHENSIVE METABOLIC PANEL
ALT: 12 IU/L (ref 0–32)
AST: 10 IU/L (ref 0–40)
Albumin/Globulin Ratio: 1.5 (ref 1.2–2.2)
Albumin: 4.1 g/dL (ref 3.9–4.9)
Alkaline Phosphatase: 68 IU/L (ref 44–121)
BUN/Creatinine Ratio: 11 (ref 9–23)
BUN: 9 mg/dL (ref 6–24)
Bilirubin Total: 0.2 mg/dL (ref 0.0–1.2)
CO2: 22 mmol/L (ref 20–29)
Calcium: 9.2 mg/dL (ref 8.7–10.2)
Chloride: 105 mmol/L (ref 96–106)
Creatinine, Ser: 0.83 mg/dL (ref 0.57–1.00)
Globulin, Total: 2.8 g/dL (ref 1.5–4.5)
Glucose: 89 mg/dL (ref 70–99)
Potassium: 3.6 mmol/L (ref 3.5–5.2)
Sodium: 141 mmol/L (ref 134–144)
Total Protein: 6.9 g/dL (ref 6.0–8.5)
eGFR: 89 mL/min/{1.73_m2} (ref >59)

## 2022-09-08 LAB — CBC WITH DIFFERENTIAL/PLATELET
Basophils Absolute: 0 10*3/uL (ref 0.0–0.2)
Basos: 0 %
EOS (ABSOLUTE): 0.1 10*3/uL (ref 0.0–0.4)
Eos: 2 %
Hematocrit: 36.2 % (ref 34.0–46.6)
Hemoglobin: 12.7 g/dL (ref 11.1–15.9)
Immature Grans (Abs): 0 10*3/uL (ref 0.0–0.1)
Immature Granulocytes: 0 %
Lymphocytes Absolute: 2.4 10*3/uL (ref 0.7–3.1)
Lymphs: 39 %
MCH: 31.1 pg (ref 26.6–33.0)
MCHC: 35.1 g/dL (ref 31.5–35.7)
MCV: 89 fL (ref 79–97)
Monocytes Absolute: 0.4 10*3/uL (ref 0.1–0.9)
Monocytes: 6 %
Neutrophils Absolute: 3.4 10*3/uL (ref 1.4–7.0)
Neutrophils: 53 %
Platelets: 360 10*3/uL (ref 150–450)
RBC: 4.09 x10E6/uL (ref 3.77–5.28)
RDW: 13.3 % (ref 11.7–15.4)
WBC: 6.3 10*3/uL (ref 3.4–10.8)

## 2022-09-08 LAB — LIPID PANEL W/O CHOL/HDL RATIO
Cholesterol, Total: 174 mg/dL (ref 100–199)
HDL: 37 mg/dL — ABNORMAL LOW (ref >39)
LDL Chol Calc (NIH): 121 mg/dL — ABNORMAL HIGH (ref 0–99)
Triglycerides: 84 mg/dL (ref 0–149)
VLDL Cholesterol Cal: 16 mg/dL (ref 5–40)

## 2022-09-08 LAB — TSH: TSH: 0.904 u[IU]/mL (ref 0.450–4.500)

## 2022-09-21 ENCOUNTER — Ambulatory Visit: Payer: BC Managed Care – PPO

## 2022-09-21 VITALS — BP 180/100 | HR 84

## 2022-09-21 DIAGNOSIS — Z0130 Encounter for examination of blood pressure without abnormal findings: Secondary | ICD-10-CM

## 2022-09-21 MED ORDER — CARVEDILOL 6.25 MG PO TABS: 3.1250 mg | ORAL_TABLET | Freq: Two times a day (BID) | ORAL | 11 refills | Status: DC

## 2022-09-21 MED ORDER — CARVEDILOL 6.25 MG PO TABS: 6.2500 mg | ORAL_TABLET | Freq: Two times a day (BID) | ORAL | 11 refills | Status: DC

## 2022-09-21 NOTE — Progress Notes (Signed)
Patient reported that she has been taking all bp medications consistently.   Patient will start taking carvedilol 3.'125mg'$  two tabs twice daily. Will return for repeat bp check.

## 2022-09-28 ENCOUNTER — Ambulatory Visit: Payer: BC Managed Care – PPO

## 2022-09-28 VITALS — BP 138/78 | HR 76

## 2022-09-28 DIAGNOSIS — Z0130 Encounter for examination of blood pressure without abnormal findings: Secondary | ICD-10-CM

## 2022-09-28 NOTE — Progress Notes (Unsigned)
Patient has been taking bp medication consistently. Carvedilol is still causing fatigue, but the symptom has been improving.   Patient notified that there is no changes to medication and to call us if she has any problems at her current dose.

## 2023-01-11 ENCOUNTER — Ambulatory Visit: Payer: BC Managed Care – PPO | Admitting: Internal Medicine

## 2023-01-25 ENCOUNTER — Encounter: Payer: Self-pay | Admitting: Internal Medicine

## 2023-01-25 ENCOUNTER — Ambulatory Visit (INDEPENDENT_AMBULATORY_CARE_PROVIDER_SITE_OTHER): Payer: BC Managed Care – PPO | Admitting: Internal Medicine

## 2023-01-25 VITALS — BP 140/88 | HR 80 | Resp 20 | Ht 65.5 in | Wt 249.0 lb

## 2023-01-25 DIAGNOSIS — I10 Essential (primary) hypertension: Secondary | ICD-10-CM | POA: Diagnosis not present

## 2023-01-25 MED ORDER — LISINOPRIL 20 MG PO TABS: ORAL_TABLET | ORAL | 3 refills | Status: DC

## 2023-01-25 NOTE — Progress Notes (Signed)
    Subjective:    Patient ID: Cindy Daniel, female   DOB: December 23, 1975, 47 y.o.   MRN: [id number]   HPI  Hypertension:  Stopped taking Carvedilol as she feels it making her fatigued and maybe even depressed and also feels it was causing LLQ pain.  She stopped 5 days ago and those symptoms went away.  She states she took the medication for 30 days prior.   Concerned as she was not having good production at work when on Carvedilol and now feels she is doing better and has more energy just being off in 5 days.   She does feel the lisinopril works well for her.   She also is doing the intermittent fast, but changed to 7 a.m to 7 p.m. and seems to be working better than fasting through the night.       Current Meds  Medication Sig   calcium-vitamin D (OSCAL WITH D) 500-5 MG-MCG tablet 1 tab by mouth daily.   carvedilol (COREG) 6.25 MG tablet Take 1 tablet (6.25 mg total) by mouth 2 (two) times daily with a meal.   fexofenadine (ALLEGRA) 180 MG tablet Take 1 tablet (180 mg total) by mouth daily.   fluticasone (FLONASE) 50 MCG/ACT nasal spray Place 2 sprays into both nostrils daily.   lisinopril (ZESTRIL) 10 MG tablet 1 tab by mouth daily with Lisinopril/HCTZ   lisinopril-hydrochlorothiazide (ZESTORETIC) 20-25 MG tablet Take 1 tablet by mouth daily.   medroxyPROGESTERone (DEPO-PROVERA) 150 MG/ML injection Inject 150 mg into the muscle every 3 (three) months. [facility]   No Known Allergies   Review of Systems    Objective:   BP (!) 140/88 (BP Location: Left Arm, Patient Position: Sitting, Cuff Size: Normal)   Pulse 80   Resp 20   Ht 5' 5.5" (1.664 m)   Wt 249 lb (112.9 kg)   BMI 40.81 kg/m   Physical Exam NAD Lungs:  CTA CV:  RRR without murmur or rub.  Radial pulses normal and equal LE:  No edema.  Assessment & Plan  Hypertension:  discontinue Carvedilol and increase Lisinopril to total and max of 40 mg with hydrochlorothiazide BP check with BMP in 1 month.  CPE without gyn  portion in 6 months.

## 2023-03-29 ENCOUNTER — Ambulatory Visit: Payer: BC Managed Care – PPO

## 2023-03-29 VITALS — BP 138/90 | HR 80

## 2023-03-29 DIAGNOSIS — Z79899 Other long term (current) drug therapy: Secondary | ICD-10-CM | POA: Diagnosis not present

## 2023-03-29 DIAGNOSIS — Z0130 Encounter for examination of blood pressure without abnormal findings: Secondary | ICD-10-CM | POA: Diagnosis not present

## 2023-03-29 MED ORDER — AMLODIPINE BESYLATE 5 MG PO TABS: 5.0000 mg | ORAL_TABLET | Freq: Every day | ORAL | 3 refills | Status: DC

## 2023-03-29 NOTE — Progress Notes (Signed)
Patient reports that she has been taking bp medication consistently.  Patient will start Amlodipine 5mg  daily and return in 2 weeks for bp recheck.

## 2023-03-30 LAB — BASIC METABOLIC PANEL
BUN/Creatinine Ratio: 13 (ref 9–23)
BUN: 11 mg/dL (ref 6–24)
CO2: 24 mmol/L (ref 20–29)
Calcium: 9.5 mg/dL (ref 8.7–10.2)
Chloride: 102 mmol/L (ref 96–106)
Creatinine, Ser: 0.83 mg/dL (ref 0.57–1.00)
Glucose: 85 mg/dL (ref 70–99)
Potassium: 3.3 mmol/L — ABNORMAL LOW (ref 3.5–5.2)
Sodium: 140 mmol/L (ref 134–144)
eGFR: 89 mL/min/{1.73_m2} (ref >59)

## 2023-04-12 ENCOUNTER — Ambulatory Visit: Payer: BC Managed Care – PPO

## 2023-04-12 VITALS — BP 144/90 | HR 80

## 2023-04-12 DIAGNOSIS — Z0130 Encounter for examination of blood pressure without abnormal findings: Secondary | ICD-10-CM | POA: Diagnosis not present

## 2023-04-12 NOTE — Progress Notes (Signed)
Patient reports that she has been taking bp medication consistently. Patient reports that she has only been taking amlodipine for about 1 week.

## 2023-04-18 ENCOUNTER — Telehealth: Payer: Self-pay

## 2023-04-18 NOTE — Telephone Encounter (Signed)
Telephoned patient at mobile number. Left a voice message with [facility] (scholars[facility]) contact information.

## 2023-07-13 NOTE — Telephone Encounter (Signed)
Telephoned patient at mobile number. Left a voice message with [facility] ([facility]) contact information. 

## 2023-07-24 ENCOUNTER — Other Ambulatory Visit: Payer: Self-pay | Admitting: Obstetrics & Gynecology

## 2023-07-24 DIAGNOSIS — Z1231 Encounter for screening mammogram for malignant neoplasm of breast: Secondary | ICD-10-CM

## 2023-07-31 ENCOUNTER — Encounter: Payer: BC Managed Care – PPO | Admitting: Internal Medicine

## 2023-08-31 ENCOUNTER — Inpatient Hospital Stay: Admission: RE | Admit: 2023-08-31 | Payer: Self-pay | Source: Ambulatory Visit

## 2023-09-07 ENCOUNTER — Ambulatory Visit
Admission: RE | Admit: 2023-09-07 | Discharge: 2023-09-07 | Disposition: A | Discharge status: Home / Self Care | Payer: No Typology Code available for payment source | Source: Ambulatory Visit | Attending: Internal Medicine | Admitting: Internal Medicine

## 2023-09-07 DIAGNOSIS — Z1231 Encounter for screening mammogram for malignant neoplasm of breast: Secondary | ICD-10-CM

## 2023-09-29 ENCOUNTER — Telehealth: Payer: Self-pay

## 2023-09-29 NOTE — Telephone Encounter (Signed)
 Patient would like to be on wait list for a sooner CPE appointment than August. Patient's appointment was cancel In 07/31/2023. Due to system down.  We will call patient when there is a cancellation.

## 2023-10-24 ENCOUNTER — Other Ambulatory Visit: Payer: Self-pay

## 2023-10-24 MED ORDER — LISINOPRIL-HYDROCHLOROTHIAZIDE 20-25 MG PO TABS: 1.0000 | ORAL_TABLET | Freq: Every day | ORAL | 5 refills | Status: DC

## 2023-10-24 NOTE — Telephone Encounter (Signed)
Called patient, patient did not answer.

## 2023-11-16 NOTE — Telephone Encounter (Signed)
Called patient to offer appointment, patient did not answer.

## 2024-01-05 NOTE — Telephone Encounter (Signed)
 Called patient to offer appointment, patient did not take.

## 2024-02-10 ENCOUNTER — Other Ambulatory Visit: Payer: Self-pay | Admitting: Internal Medicine

## 2024-03-14 ENCOUNTER — Encounter: Payer: Self-pay | Admitting: Internal Medicine

## 2024-03-14 ENCOUNTER — Ambulatory Visit: Admitting: Internal Medicine

## 2024-03-14 VITALS — BP 120/80 | HR 73 | Resp 19 | Ht 66.0 in | Wt 243.0 lb

## 2024-03-14 DIAGNOSIS — I10 Essential (primary) hypertension: Secondary | ICD-10-CM

## 2024-03-14 DIAGNOSIS — R609 Edema, unspecified: Secondary | ICD-10-CM | POA: Insufficient documentation

## 2024-03-14 DIAGNOSIS — Z1211 Encounter for screening for malignant neoplasm of colon: Secondary | ICD-10-CM

## 2024-03-14 DIAGNOSIS — Z0000 Encounter for general adult medical examination without abnormal findings: Secondary | ICD-10-CM

## 2024-03-14 DIAGNOSIS — R0683 Snoring: Secondary | ICD-10-CM | POA: Insufficient documentation

## 2024-03-14 DIAGNOSIS — Z5948 Other specified lack of adequate food: Secondary | ICD-10-CM | POA: Insufficient documentation

## 2024-03-14 DIAGNOSIS — R400 Somnolence: Secondary | ICD-10-CM | POA: Insufficient documentation

## 2024-03-14 MED ORDER — LISINOPRIL 20 MG PO TABS: ORAL_TABLET | ORAL | 0 refills | Status: DC

## 2024-03-14 MED ORDER — AMLODIPINE BESYLATE 5 MG PO TABS: 5.0000 mg | ORAL_TABLET | Freq: Every day | ORAL | 3 refills | Status: AC

## 2024-03-14 NOTE — Patient Instructions (Signed)
 Get a COVID vaccine in Sept/Oct

## 2024-03-14 NOTE — Telephone Encounter (Signed)
 Patient will be seen today 03/14/24.

## 2024-03-14 NOTE — Progress Notes (Unsigned)
    Subjective:    Patient ID: Cindy Daniel, female   DOB: 08/06/1975, 48 y.o.   MRN: [id number]   HPI  CPE without pap  1.  Pap:  done at [facility].  Last 03/2023 and normal.  Did have abnormal pap in 2010 that resolved without treatment.  2.  Mammogram:  Last 08/2023 and normal.   No family history of breast cancer.    3.  Osteoprevention:  Taking calcium /Vitamin D once daily and 2 servings cheese daily.  Walks 3 times weekly outside 5 min every 2 hours.    4.  Guaiac Cards/FIT:  Never returned.    5.  Colonoscopy:  Never.  Distant family history--perhaps her great great grandmother  56.  Immunizations:  Does not want the flu vaccine. Immunization History  Administered Date(s) Administered   Hep A / Hep B 03/09/2016, 04/13/2016   Hepatitis A, Adult 09/07/2022   Hepatitis B, ADULT 09/07/2022   PFIZER(Purple Top)SARS-COV-2 Vaccination 01/20/2020, 02/10/2020   Pfizer Covid-19 Vaccine Bivalent Booster 70yrs & up 05/18/2021   Tdap 03/09/2016     7.  Glucose/Cholesterol:  glucose has always been fine.  Low HDL in past.  LDL acceptable.   Lipid Panel     Component Value Date/Time   CHOL 174 09/07/2022 1751   TRIG 84 09/07/2022 1751   HDL 37 (L) 09/07/2022 1751   CHOLHDL 4.2 Ratio 06/22/2010 2112   VLDL 18 06/22/2010 2112   LDLCALC 121 (H) 09/07/2022 1751   LABVLDL 16 09/07/2022 1751     Current Meds  Medication Sig   amLODipine  (NORVASC ) 5 MG tablet Take 1 tablet (5 mg total) by mouth daily.   calcium -vitamin D (OSCAL WITH D) 500-5 MG-MCG tablet 1 tab by mouth daily.   fexofenadine  (ALLEGRA ) 180 MG tablet Take 1 tablet (180 mg total) by mouth daily.   lisinopril  (ZESTRIL ) 20 MG tablet TAKE 1 TABLET BY MOUTH ONCE DAILY WITH  LISINOPRIL /HCTZ   lisinopril -hydrochlorothiazide  (ZESTORETIC ) 20-25 MG tablet Take 1 tablet by mouth daily.   medroxyPROGESTERone (DEPO-PROVERA) 150 MG/ML injection Inject 150 mg into the muscle every 3 (three) months. [facility]   [DISCONTINUED] lisinopril   (ZESTRIL ) 20 MG tablet TAKE 1 TABLET BY MOUTH ONCE DAILY WITH  LISINOPRIL /HCTZ   No Known Allergies   Review of Systems  HENT:  Negative for dental problem (Does not have care.  Last visit 3-4 years ago.).   Eyes:  Negative for visual disturbance.  Respiratory:  Negative for shortness of breath.   Cardiovascular:  Negative for chest pain, palpitations and leg swelling.  Gastrointestinal:  Negative for anal bleeding and blood in stool (No melena).  Neurological:  Negative for weakness and numbness.  Psychiatric/Behavioral:  Positive for sleep disturbance (snores and + daytime somnolence.  Does not want to consider CPAP at this time so does not want sleep study.). Negative for dysphoric mood. The patient is not nervous/anxious.       Objective:   BP 120/80 (BP Location: Left Arm, Patient Position: Sitting, Cuff Size: Normal)   Pulse 73   Resp 19   Ht 5' 6 (1.676 m)   Wt 243 lb (110.2 kg)   LMP  (Approximate) Comment: menstrual once yearly--since on Depo provera  BMI 39.22 kg/m   Physical Exam   Assessment & Plan  Will let me know about sleep study.   Declines appt for influenza

## 2024-04-05 ENCOUNTER — Other Ambulatory Visit (INDEPENDENT_AMBULATORY_CARE_PROVIDER_SITE_OTHER)

## 2024-04-05 DIAGNOSIS — Z79899 Other long term (current) drug therapy: Secondary | ICD-10-CM | POA: Diagnosis not present

## 2024-04-05 DIAGNOSIS — E7800 Pure hypercholesterolemia, unspecified: Secondary | ICD-10-CM | POA: Diagnosis not present

## 2024-04-05 DIAGNOSIS — R609 Edema, unspecified: Secondary | ICD-10-CM | POA: Diagnosis not present

## 2024-04-06 LAB — CBC WITH DIFFERENTIAL/PLATELET
Basophils Absolute: 0 x10E3/uL (ref 0.0–0.2)
Basos: 0 %
EOS (ABSOLUTE): 0.1 x10E3/uL (ref 0.0–0.4)
Eos: 2 %
Hematocrit: 37.6 % (ref 34.0–46.6)
Hemoglobin: 12.8 g/dL (ref 11.1–15.9)
Immature Grans (Abs): 0 x10E3/uL (ref 0.0–0.1)
Immature Granulocytes: 0 %
Lymphocytes Absolute: 2.1 x10E3/uL (ref 0.7–3.1)
Lymphs: 31 %
MCH: 32.4 pg (ref 26.6–33.0)
MCHC: 34 g/dL (ref 31.5–35.7)
MCV: 95 fL (ref 79–97)
Monocytes Absolute: 0.3 x10E3/uL (ref 0.1–0.9)
Monocytes: 5 %
Neutrophils Absolute: 4.1 x10E3/uL (ref 1.4–7.0)
Neutrophils: 61 %
Platelets: 292 x10E3/uL (ref 150–450)
RBC: 3.95 x10E6/uL (ref 3.77–5.28)
RDW: 13.2 % (ref 11.7–15.4)
WBC: 6.7 x10E3/uL (ref 3.4–10.8)

## 2024-04-06 LAB — TSH: TSH: 0.742 u[IU]/mL (ref 0.450–4.500)

## 2024-04-06 LAB — LIPID PANEL W/O CHOL/HDL RATIO
Cholesterol, Total: 166 mg/dL (ref 100–199)
HDL: 31 mg/dL — ABNORMAL LOW (ref >39)
LDL Chol Calc (NIH): 114 mg/dL — ABNORMAL HIGH (ref 0–99)
Triglycerides: 114 mg/dL (ref 0–149)
VLDL Cholesterol Cal: 21 mg/dL (ref 5–40)

## 2024-04-06 LAB — COMPREHENSIVE METABOLIC PANEL WITH GFR
ALT: 14 IU/L (ref 0–32)
AST: 15 IU/L (ref 0–40)
Albumin: 4 g/dL (ref 3.9–4.9)
Alkaline Phosphatase: 66 IU/L (ref 44–121)
BUN/Creatinine Ratio: 12 (ref 9–23)
BUN: 13 mg/dL (ref 6–24)
Bilirubin Total: 0.4 mg/dL (ref 0.0–1.2)
CO2: 22 mmol/L (ref 20–29)
Calcium: 9.3 mg/dL (ref 8.7–10.2)
Chloride: 102 mmol/L (ref 96–106)
Creatinine, Ser: 1.08 mg/dL — ABNORMAL HIGH (ref 0.57–1.00)
Globulin, Total: 3.1 g/dL (ref 1.5–4.5)
Glucose: 94 mg/dL (ref 70–99)
Potassium: 3.5 mmol/L (ref 3.5–5.2)
Sodium: 140 mmol/L (ref 134–144)
Total Protein: 7.1 g/dL (ref 6.0–8.5)
eGFR: 65 mL/min/1.73 (ref >59)

## 2024-04-16 ENCOUNTER — Other Ambulatory Visit: Payer: Self-pay | Admitting: Obstetrics & Gynecology

## 2024-04-16 DIAGNOSIS — Z1231 Encounter for screening mammogram for malignant neoplasm of breast: Secondary | ICD-10-CM

## 2024-04-20 ENCOUNTER — Other Ambulatory Visit: Payer: Self-pay | Admitting: Internal Medicine

## 2024-05-08 ENCOUNTER — Encounter: Payer: Self-pay | Admitting: Internal Medicine

## 2024-05-26 ENCOUNTER — Ambulatory Visit: Payer: Self-pay | Admitting: Internal Medicine

## 2024-05-27 NOTE — Progress Notes (Signed)
I called and let them know.

## 2024-05-29 ENCOUNTER — Other Ambulatory Visit: Payer: Self-pay | Admitting: Internal Medicine

## 2024-09-12 ENCOUNTER — Encounter

## 2024-09-16 ENCOUNTER — Ambulatory Visit: Admitting: Internal Medicine

## 2025-03-14 ENCOUNTER — Other Ambulatory Visit

## 2025-03-18 ENCOUNTER — Encounter: Admitting: Internal Medicine
